# Patient Record
Sex: Female | Born: 1980 | Race: Black or African American | Hispanic: No | Marital: Single | Smoking: Never smoker
Health system: Southern US, Community
[De-identification: ages and names within clinical notes are randomized; demographics above are authoritative.]

## PROBLEM LIST (undated history)

## (undated) ENCOUNTER — Inpatient Hospital Stay (HOSPITAL_COMMUNITY): Payer: Self-pay

## (undated) ENCOUNTER — Emergency Department (HOSPITAL_COMMUNITY): Admission: EM | Payer: Medicaid Other | Source: Home / Self Care

## (undated) DIAGNOSIS — B009 Herpesviral infection, unspecified: Secondary | ICD-10-CM

## (undated) HISTORY — PX: ROTATOR CUFF REPAIR: SHX139

## (undated) HISTORY — PX: NO PAST SURGERIES: SHX2092

---

## 2000-01-28 ENCOUNTER — Other Ambulatory Visit: Admission: RE | Admit: 2000-01-28 | Discharge: 2000-01-28 | Payer: Self-pay | Admitting: Obstetrics

## 2000-04-08 ENCOUNTER — Inpatient Hospital Stay (HOSPITAL_COMMUNITY): Admission: AD | Admit: 2000-04-08 | Discharge: 2000-04-08 | Payer: Self-pay | Admitting: Obstetrics

## 2000-07-03 ENCOUNTER — Encounter: Payer: Self-pay | Admitting: Obstetrics

## 2000-07-03 ENCOUNTER — Inpatient Hospital Stay (HOSPITAL_COMMUNITY): Admission: AD | Admit: 2000-07-03 | Discharge: 2000-07-03 | Payer: Self-pay | Admitting: Obstetrics

## 2000-07-10 ENCOUNTER — Inpatient Hospital Stay (HOSPITAL_COMMUNITY): Admission: AD | Admit: 2000-07-10 | Discharge: 2000-07-12 | Payer: Self-pay | Admitting: Obstetrics

## 2001-01-25 ENCOUNTER — Other Ambulatory Visit: Admission: RE | Admit: 2001-01-25 | Discharge: 2001-01-25 | Payer: Self-pay | Admitting: Obstetrics

## 2001-01-31 ENCOUNTER — Emergency Department (HOSPITAL_COMMUNITY): Admission: EM | Admit: 2001-01-31 | Discharge: 2001-01-31 | Payer: Self-pay | Admitting: Internal Medicine

## 2001-02-02 ENCOUNTER — Emergency Department (HOSPITAL_COMMUNITY): Admission: EM | Admit: 2001-02-02 | Discharge: 2001-02-02 | Payer: Self-pay | Admitting: Emergency Medicine

## 2001-07-29 ENCOUNTER — Emergency Department (HOSPITAL_COMMUNITY): Admission: EM | Admit: 2001-07-29 | Discharge: 2001-07-29 | Payer: Self-pay | Admitting: Emergency Medicine

## 2002-01-13 ENCOUNTER — Other Ambulatory Visit: Admission: RE | Admit: 2002-01-13 | Discharge: 2002-01-13 | Payer: Self-pay | Admitting: Ophthalmology

## 2002-02-28 ENCOUNTER — Emergency Department (HOSPITAL_COMMUNITY): Admission: EM | Admit: 2002-02-28 | Discharge: 2002-02-28 | Payer: Self-pay | Admitting: Emergency Medicine

## 2002-04-28 ENCOUNTER — Inpatient Hospital Stay (HOSPITAL_COMMUNITY): Admission: AD | Admit: 2002-04-28 | Discharge: 2002-04-28 | Payer: Self-pay | Admitting: *Deleted

## 2002-04-29 ENCOUNTER — Inpatient Hospital Stay (HOSPITAL_COMMUNITY): Admission: RE | Admit: 2002-04-29 | Discharge: 2002-04-29 | Payer: Self-pay | Admitting: *Deleted

## 2002-11-12 ENCOUNTER — Encounter: Payer: Self-pay | Admitting: Obstetrics

## 2002-11-12 ENCOUNTER — Inpatient Hospital Stay (HOSPITAL_COMMUNITY): Admission: AD | Admit: 2002-11-12 | Discharge: 2002-11-12 | Payer: Self-pay | Admitting: Obstetrics

## 2002-11-17 ENCOUNTER — Inpatient Hospital Stay (HOSPITAL_COMMUNITY): Admission: AD | Admit: 2002-11-17 | Discharge: 2002-11-20 | Payer: Self-pay | Admitting: Obstetrics

## 2003-05-22 ENCOUNTER — Emergency Department (HOSPITAL_COMMUNITY): Admission: EM | Admit: 2003-05-22 | Discharge: 2003-05-22 | Payer: Self-pay | Admitting: Emergency Medicine

## 2004-02-15 ENCOUNTER — Emergency Department (HOSPITAL_COMMUNITY): Admission: EM | Admit: 2004-02-15 | Discharge: 2004-02-15 | Payer: Self-pay | Admitting: Emergency Medicine

## 2005-07-19 ENCOUNTER — Emergency Department (HOSPITAL_COMMUNITY): Admission: EM | Admit: 2005-07-19 | Discharge: 2005-07-19 | Payer: Self-pay | Admitting: Family Medicine

## 2006-03-05 ENCOUNTER — Ambulatory Visit (HOSPITAL_BASED_OUTPATIENT_CLINIC_OR_DEPARTMENT_OTHER): Admission: RE | Admit: 2006-03-05 | Discharge: 2006-03-05 | Payer: Self-pay | Admitting: Family Medicine

## 2006-03-14 ENCOUNTER — Ambulatory Visit: Payer: Self-pay | Admitting: Internal Medicine

## 2006-08-25 ENCOUNTER — Other Ambulatory Visit: Admission: RE | Admit: 2006-08-25 | Discharge: 2006-08-25 | Payer: Self-pay | Admitting: Family Medicine

## 2008-02-21 ENCOUNTER — Other Ambulatory Visit: Admission: RE | Admit: 2008-02-21 | Discharge: 2008-02-21 | Payer: Self-pay | Admitting: Family Medicine

## 2009-01-07 ENCOUNTER — Emergency Department (HOSPITAL_COMMUNITY): Admission: EM | Admit: 2009-01-07 | Discharge: 2009-01-07 | Payer: Self-pay | Admitting: Family Medicine

## 2009-04-20 ENCOUNTER — Inpatient Hospital Stay (HOSPITAL_COMMUNITY): Admission: AD | Admit: 2009-04-20 | Discharge: 2009-04-21 | Payer: Self-pay | Admitting: Obstetrics & Gynecology

## 2009-06-11 ENCOUNTER — Inpatient Hospital Stay (HOSPITAL_COMMUNITY): Admission: AD | Admit: 2009-06-11 | Discharge: 2009-06-11 | Payer: Self-pay | Admitting: Obstetrics & Gynecology

## 2009-06-20 ENCOUNTER — Encounter: Admission: RE | Admit: 2009-06-20 | Discharge: 2009-06-20 | Payer: Self-pay | Admitting: Family Medicine

## 2009-08-02 ENCOUNTER — Inpatient Hospital Stay (HOSPITAL_COMMUNITY): Admission: AD | Admit: 2009-08-02 | Discharge: 2009-08-02 | Payer: Self-pay | Admitting: Obstetrics and Gynecology

## 2009-09-09 ENCOUNTER — Inpatient Hospital Stay (HOSPITAL_COMMUNITY): Admission: AD | Admit: 2009-09-09 | Discharge: 2009-09-09 | Payer: Self-pay | Admitting: Obstetrics

## 2009-09-09 ENCOUNTER — Ambulatory Visit: Payer: Self-pay | Admitting: Physician Assistant

## 2009-12-03 ENCOUNTER — Inpatient Hospital Stay (HOSPITAL_COMMUNITY): Admission: AD | Admit: 2009-12-03 | Discharge: 2009-12-03 | Payer: Self-pay | Admitting: Obstetrics and Gynecology

## 2009-12-03 ENCOUNTER — Emergency Department (HOSPITAL_COMMUNITY): Admission: EM | Admit: 2009-12-03 | Discharge: 2009-12-03 | Payer: Self-pay | Admitting: Emergency Medicine

## 2009-12-14 ENCOUNTER — Inpatient Hospital Stay (HOSPITAL_COMMUNITY): Admission: AD | Admit: 2009-12-14 | Discharge: 2009-12-14 | Payer: Self-pay | Admitting: Obstetrics & Gynecology

## 2009-12-27 ENCOUNTER — Ambulatory Visit (HOSPITAL_COMMUNITY): Admission: RE | Admit: 2009-12-27 | Discharge: 2009-12-27 | Payer: Self-pay | Admitting: Obstetrics

## 2010-02-08 ENCOUNTER — Emergency Department (HOSPITAL_COMMUNITY): Admission: EM | Admit: 2010-02-08 | Discharge: 2010-02-08 | Payer: Self-pay | Admitting: Family Medicine

## 2010-12-07 NOTE — L&D Delivery Note (Signed)
Delivery Note At  a viable unspecified sex was delivered via  (Presentation: ;  ).  APGAR: , ; weight .   Placenta status: , .  Cord:  with the following complications: .  Cord pH: not done  Anesthesia: Epidural  Episiotomy:  Lacerations:  Suture Repair: 2.0 Est. Blood Loss (mL):   Mom to postpartum.  Baby to nursery-stable.  MARSHALL,BERNARD A 10/12/2011, 5:04 PM

## 2010-12-28 ENCOUNTER — Encounter: Payer: Self-pay | Admitting: Obstetrics

## 2011-01-15 ENCOUNTER — Emergency Department (HOSPITAL_COMMUNITY)
Admission: EM | Admit: 2011-01-15 | Discharge: 2011-01-15 | Disposition: A | Discharge status: Home / Self Care | Payer: No Typology Code available for payment source | Attending: Emergency Medicine | Admitting: Emergency Medicine

## 2011-01-15 ENCOUNTER — Emergency Department (HOSPITAL_COMMUNITY): Payer: No Typology Code available for payment source

## 2011-01-15 DIAGNOSIS — S139XXA Sprain of joints and ligaments of unspecified parts of neck, initial encounter: Secondary | ICD-10-CM | POA: Insufficient documentation

## 2011-01-15 DIAGNOSIS — M542 Cervicalgia: Secondary | ICD-10-CM | POA: Insufficient documentation

## 2011-01-15 DIAGNOSIS — Y92009 Unspecified place in unspecified non-institutional (private) residence as the place of occurrence of the external cause: Secondary | ICD-10-CM | POA: Insufficient documentation

## 2011-01-15 DIAGNOSIS — S0003XA Contusion of scalp, initial encounter: Secondary | ICD-10-CM | POA: Insufficient documentation

## 2011-01-15 DIAGNOSIS — S1093XA Contusion of unspecified part of neck, initial encounter: Secondary | ICD-10-CM | POA: Insufficient documentation

## 2011-01-15 DIAGNOSIS — S0993XA Unspecified injury of face, initial encounter: Secondary | ICD-10-CM | POA: Insufficient documentation

## 2011-01-24 ENCOUNTER — Inpatient Hospital Stay (INDEPENDENT_AMBULATORY_CARE_PROVIDER_SITE_OTHER)
Admission: RE | Admit: 2011-01-24 | Discharge: 2011-01-24 | Disposition: A | Discharge status: Home / Self Care | Payer: Medicaid Other | Source: Ambulatory Visit | Attending: Family Medicine | Admitting: Family Medicine

## 2011-01-24 DIAGNOSIS — R21 Rash and other nonspecific skin eruption: Secondary | ICD-10-CM

## 2011-01-24 DIAGNOSIS — N760 Acute vaginitis: Secondary | ICD-10-CM

## 2011-01-24 LAB — WET PREP, GENITAL

## 2011-01-24 LAB — POCT PREGNANCY, URINE: Preg Test, Ur: NEGATIVE

## 2011-01-24 LAB — POCT URINALYSIS DIPSTICK
Nitrite: NEGATIVE
Protein, ur: 30 mg/dL — AB
Urobilinogen, UA: 0.2 mg/dL (ref 0.0–1.0)

## 2011-01-26 LAB — GC/CHLAMYDIA PROBE AMP, GENITAL: Chlamydia, DNA Probe: NEGATIVE

## 2011-01-26 LAB — HERPES SIMPLEX VIRUS CULTURE: Culture: DETECTED

## 2011-01-26 LAB — URINE CULTURE: Culture: NO GROWTH

## 2011-02-05 ENCOUNTER — Inpatient Hospital Stay (HOSPITAL_COMMUNITY): Payer: Medicaid Other

## 2011-02-05 ENCOUNTER — Inpatient Hospital Stay (HOSPITAL_COMMUNITY)
Admission: AD | Admit: 2011-02-05 | Discharge: 2011-02-05 | Disposition: A | Discharge status: Home / Self Care | Payer: Medicaid Other | Source: Ambulatory Visit | Attending: Obstetrics & Gynecology | Admitting: Obstetrics & Gynecology

## 2011-02-05 DIAGNOSIS — O9989 Other specified diseases and conditions complicating pregnancy, childbirth and the puerperium: Secondary | ICD-10-CM

## 2011-02-05 DIAGNOSIS — O99891 Other specified diseases and conditions complicating pregnancy: Secondary | ICD-10-CM | POA: Insufficient documentation

## 2011-02-05 DIAGNOSIS — R109 Unspecified abdominal pain: Secondary | ICD-10-CM

## 2011-02-05 LAB — URINALYSIS, ROUTINE W REFLEX MICROSCOPIC
Bilirubin Urine: NEGATIVE
Nitrite: NEGATIVE
Specific Gravity, Urine: 1.01 (ref 1.005–1.030)
Urobilinogen, UA: 0.2 mg/dL (ref 0.0–1.0)

## 2011-02-05 LAB — CBC
MCH: 28.2 pg (ref 26.0–34.0)
MCHC: 33 g/dL (ref 30.0–36.0)
MCV: 85.6 fL (ref 78.0–100.0)
Platelets: 376 10*3/uL (ref 150–400)
RDW: 13.4 % (ref 11.5–15.5)
WBC: 7.6 10*3/uL (ref 4.0–10.5)

## 2011-02-05 LAB — DIFFERENTIAL
Eosinophils Absolute: 0.1 10*3/uL (ref 0.0–0.7)
Eosinophils Relative: 2 % (ref 0–5)
Lymphs Abs: 2.1 10*3/uL (ref 0.7–4.0)
Monocytes Relative: 7 % (ref 3–12)

## 2011-02-05 LAB — COMPREHENSIVE METABOLIC PANEL
ALT: 14 U/L (ref 0–35)
AST: 18 U/L (ref 0–37)
CO2: 28 mEq/L (ref 19–32)
Chloride: 106 mEq/L (ref 96–112)
GFR calc Af Amer: >60 mL/min (ref >60)
GFR calc non Af Amer: >60 mL/min (ref >60)
Sodium: 138 mEq/L (ref 135–145)
Total Bilirubin: 0.6 mg/dL (ref 0.3–1.2)

## 2011-02-05 LAB — POCT PREGNANCY, URINE: Preg Test, Ur: POSITIVE

## 2011-02-05 LAB — URINE MICROSCOPIC-ADD ON

## 2011-02-22 LAB — WET PREP, GENITAL
Trich, Wet Prep: NONE SEEN
Yeast Wet Prep HPF POC: NONE SEEN

## 2011-02-22 LAB — URINALYSIS, ROUTINE W REFLEX MICROSCOPIC
Glucose, UA: NEGATIVE mg/dL
Hgb urine dipstick: NEGATIVE
Ketones, ur: NEGATIVE mg/dL
Protein, ur: NEGATIVE mg/dL
pH: 5.5 (ref 5.0–8.0)

## 2011-02-22 LAB — POCT PREGNANCY, URINE: Preg Test, Ur: NEGATIVE

## 2011-02-22 LAB — GC/CHLAMYDIA PROBE AMP, GENITAL: Chlamydia, DNA Probe: NEGATIVE

## 2011-03-09 LAB — URINALYSIS, ROUTINE W REFLEX MICROSCOPIC
Bilirubin Urine: NEGATIVE
Glucose, UA: NEGATIVE mg/dL
Ketones, ur: NEGATIVE mg/dL
Ketones, ur: NEGATIVE mg/dL
Nitrite: NEGATIVE
Protein, ur: NEGATIVE mg/dL
Urobilinogen, UA: 0.2 mg/dL (ref 0.0–1.0)

## 2011-03-09 LAB — WET PREP, GENITAL

## 2011-03-09 LAB — GC/CHLAMYDIA PROBE AMP, GENITAL: Chlamydia, DNA Probe: NEGATIVE

## 2011-03-09 LAB — URINE MICROSCOPIC-ADD ON

## 2011-03-09 LAB — PREGNANCY, URINE: Preg Test, Ur: NEGATIVE

## 2011-03-10 LAB — SICKLE CELL SCREEN: Sickle Cell Screen: POSITIVE

## 2011-03-13 LAB — GC/CHLAMYDIA PROBE AMP, GENITAL
Chlamydia, DNA Probe: NEGATIVE
GC Probe Amp, Genital: NEGATIVE

## 2011-03-13 LAB — URINALYSIS, ROUTINE W REFLEX MICROSCOPIC
Glucose, UA: NEGATIVE mg/dL
Hgb urine dipstick: NEGATIVE
Protein, ur: NEGATIVE mg/dL
pH: 7 (ref 5.0–8.0)

## 2011-03-13 LAB — WET PREP, GENITAL
Trich, Wet Prep: NONE SEEN
Yeast Wet Prep HPF POC: NONE SEEN

## 2011-03-16 LAB — HCG, QUANTITATIVE, PREGNANCY: hCG, Beta Chain, Quant, S: <2 m[IU]/mL (ref <5)

## 2011-03-16 LAB — COMPREHENSIVE METABOLIC PANEL
BUN: 10 mg/dL (ref 6–23)
CO2: 25 mEq/L (ref 19–32)
Calcium: 9.5 mg/dL (ref 8.4–10.5)
GFR calc non Af Amer: >60 mL/min (ref >60)
Glucose, Bld: 84 mg/dL (ref 70–99)
Total Protein: 7.2 g/dL (ref 6.0–8.3)

## 2011-03-16 LAB — ABO/RH: ABO/RH(D): O POS

## 2011-03-16 LAB — CBC
HCT: 38.8 % (ref 36.0–46.0)
Hemoglobin: 13.3 g/dL (ref 12.0–15.0)
MCHC: 34.2 g/dL (ref 30.0–36.0)
MCV: 90.1 fL (ref 78.0–100.0)
RDW: 12.5 % (ref 11.5–15.5)

## 2011-03-17 LAB — CBC
HCT: 34.1 % — ABNORMAL LOW (ref 36.0–46.0)
Hemoglobin: 11.9 g/dL — ABNORMAL LOW (ref 12.0–15.0)
RBC: 3.83 MIL/uL — ABNORMAL LOW (ref 3.87–5.11)
RDW: 12.8 % (ref 11.5–15.5)

## 2011-03-17 LAB — URINALYSIS, ROUTINE W REFLEX MICROSCOPIC
Bilirubin Urine: NEGATIVE
Nitrite: NEGATIVE
Protein, ur: NEGATIVE mg/dL
Specific Gravity, Urine: >1.03 — ABNORMAL HIGH (ref 1.005–1.030)
Urobilinogen, UA: 0.2 mg/dL (ref 0.0–1.0)

## 2011-03-17 LAB — WET PREP, GENITAL: Clue Cells Wet Prep HPF POC: NONE SEEN

## 2011-03-17 LAB — GC/CHLAMYDIA PROBE AMP, GENITAL
Chlamydia, DNA Probe: NEGATIVE
GC Probe Amp, Genital: NEGATIVE

## 2011-03-24 LAB — WET PREP, GENITAL
Trich, Wet Prep: NONE SEEN
Yeast Wet Prep HPF POC: NONE SEEN

## 2011-03-24 LAB — POCT URINALYSIS DIP (DEVICE)
Bilirubin Urine: NEGATIVE
Glucose, UA: NEGATIVE mg/dL
Nitrite: NEGATIVE
Specific Gravity, Urine: 1.015 (ref 1.005–1.030)
pH: 6.5 (ref 5.0–8.0)

## 2011-03-24 LAB — URINE CULTURE: Colony Count: 40000

## 2011-03-24 LAB — RPR: RPR Ser Ql: NONREACTIVE

## 2011-03-24 LAB — GC/CHLAMYDIA PROBE AMP, GENITAL: Chlamydia, DNA Probe: NEGATIVE

## 2011-04-24 NOTE — Procedures (Signed)
NAME:  Julia Chang, Julia Chang             ACCOUNT NO.:  [id number]   MEDICAL RECORD NO.:  [id number]          PATIENT TYPE:  OUT   LOCATION:  SLEEP CENTER                 FACILITY:  [facility]   PHYSICIAN:  Clinton D. Maple Hudson, M.D. DATE OF BIRTH:  [birth year]   DATE OF STUDY:  03/05/2006                              NOCTURNAL POLYSOMNOGRAM   REFERRING PHYSICIAN:  Dr. Renaye Rakers.   INDICATIONS FOR STUDY:  Hypersomnia with daytime somnolence.  Epworth  sleepiness score 23/24, BMI 28.9.  Weight 180 pounds.  No home medications  listed.   SLEEP ARCHITECTURE:  Total sleep time 395 minutes with sleep efficiency 97%.  Stage I was 3%, stage II 57%, stages III and IV 22%, REM 18% of total sleep  time.  Sleep latency 5 minutes, REM latency 63 minutes, awake after sleep  onset 7 minutes, arousal index increased at 38.9 indicating increased sleep  fragmentation not explained by other study results.  Technician commented  that the patient was very sleepy when she arrived, stating that she had been  up since 4:30 a.m.   RESPIRATORY DATA:  Apnea/hypopnea index (HPI, RDI) 5.6 obstructive events  per hour indicating minimal obstructive sleep apnea/hypopnea syndrome  (normal range 0 to 5 per hour).  She slept only supine.  REM AHI 17.3 per  hour.  MPSG protocol was requested.   OXYGEN DATA:  Mild snoring with oxygen desaturation to a nadir of 92%.   CARDIAC DATA:  Normal sinus rhythm.   MOVEMENT/PARASOMNIA:  Occasional limb jerks with insignificant effect on  sleep.   IMPRESSION/RECOMMENDATIONS:  1.  Unremarkable sleep architecture with relatively high stages III and IV,      not inconsistent with the patient's age.  2.  Minimal obstructive sleep apnea/hypopnea syndrome, AHI 5.6 per hour with      mild snoring and normal oxygenation.  3.  Periodic limb movement with arousal, 1.8 per hour.  4.  The degree of daytime sleepiness described and supported by the      patient's Epworth sleepiness score of 23/24  seemed out of line with the      sleep architecture, sleep disordered breathing and periodic limb      movement frequency recorded on this study.  If history suggests the      possibility of narcolepsy or another primary disorder      of excessive daytime somnolence as opposed to insufficient sleep due to      poor sleep hygiene, then consider return for a multiple sleep latency      test (MSLT).      Clinton D. Maple Hudson, M.D.  Diplomate, Biomedical engineer of Sleep Medicine  Electronically Signed     CDY/MEDQ  D:  03/14/2006 10:43:13  T:  03/15/2006 06:10:46  Job:  [id number]

## 2011-06-03 ENCOUNTER — Inpatient Hospital Stay (HOSPITAL_COMMUNITY): Payer: Medicaid Other

## 2011-06-03 ENCOUNTER — Inpatient Hospital Stay (HOSPITAL_COMMUNITY)
Admission: AD | Admit: 2011-06-03 | Discharge: 2011-06-03 | Disposition: A | Discharge status: Home / Self Care | Payer: Medicaid Other | Source: Ambulatory Visit | Attending: Obstetrics | Admitting: Obstetrics

## 2011-06-03 DIAGNOSIS — O9989 Other specified diseases and conditions complicating pregnancy, childbirth and the puerperium: Secondary | ICD-10-CM

## 2011-06-03 DIAGNOSIS — O99891 Other specified diseases and conditions complicating pregnancy: Secondary | ICD-10-CM | POA: Insufficient documentation

## 2011-06-03 DIAGNOSIS — R109 Unspecified abdominal pain: Secondary | ICD-10-CM | POA: Insufficient documentation

## 2011-06-03 DIAGNOSIS — Y92410 Unspecified street and highway as the place of occurrence of the external cause: Secondary | ICD-10-CM | POA: Insufficient documentation

## 2011-06-03 LAB — URINALYSIS, ROUTINE W REFLEX MICROSCOPIC
Bilirubin Urine: NEGATIVE
Nitrite: NEGATIVE
Protein, ur: NEGATIVE mg/dL
Specific Gravity, Urine: 1.025 (ref 1.005–1.030)
Urobilinogen, UA: 0.2 mg/dL (ref 0.0–1.0)

## 2011-08-09 ENCOUNTER — Encounter (HOSPITAL_COMMUNITY): Payer: Self-pay | Admitting: *Deleted

## 2011-08-09 ENCOUNTER — Inpatient Hospital Stay (HOSPITAL_COMMUNITY)
Admission: AD | Admit: 2011-08-09 | Discharge: 2011-08-09 | Disposition: A | Discharge status: Home / Self Care | Payer: Medicaid Other | Source: Ambulatory Visit | Attending: Obstetrics | Admitting: Obstetrics

## 2011-08-09 DIAGNOSIS — R109 Unspecified abdominal pain: Secondary | ICD-10-CM

## 2011-08-09 DIAGNOSIS — B3731 Acute candidiasis of vulva and vagina: Secondary | ICD-10-CM

## 2011-08-09 DIAGNOSIS — O99891 Other specified diseases and conditions complicating pregnancy: Secondary | ICD-10-CM | POA: Insufficient documentation

## 2011-08-09 DIAGNOSIS — B373 Candidiasis of vulva and vagina: Secondary | ICD-10-CM | POA: Insufficient documentation

## 2011-08-09 DIAGNOSIS — O2390 Unspecified genitourinary tract infection in pregnancy, unspecified trimester: Secondary | ICD-10-CM | POA: Insufficient documentation

## 2011-08-09 DIAGNOSIS — O26899 Other specified pregnancy related conditions, unspecified trimester: Secondary | ICD-10-CM

## 2011-08-09 HISTORY — DX: Herpesviral infection, unspecified: B00.9

## 2011-08-09 LAB — URINE MICROSCOPIC-ADD ON

## 2011-08-09 LAB — WET PREP, GENITAL: Yeast Wet Prep HPF POC: NONE SEEN

## 2011-08-09 LAB — URINALYSIS, ROUTINE W REFLEX MICROSCOPIC
Glucose, UA: NEGATIVE mg/dL
Ketones, ur: NEGATIVE mg/dL
Protein, ur: NEGATIVE mg/dL
Urobilinogen, UA: 0.2 mg/dL (ref 0.0–1.0)

## 2011-08-09 MED ORDER — TERCONAZOLE 0.4 % VA CREA: 1.0000 | TOPICAL_CREAM | Freq: Every day | VAGINAL | Status: AC

## 2011-08-09 NOTE — Progress Notes (Signed)
Patient is brought in by ems with c/o a sudden onset of upper abdominal constant sharp pain 2 hours ago. She is moving about in pain and moaning loudly. She denies any vaginal bleeding, lof or discharge. Her abdominal is soft and non-tender. She reports good fetal movement. Denies any problem this pregnant.

## 2011-08-09 NOTE — Progress Notes (Signed)
Pt states, " I started having pain in my upper abdomen approx 2 hrs ago

## 2011-08-09 NOTE — ED Provider Notes (Signed)
History     Chief Complaint  Patient presents with  . Abdominal Pain   HPI Julia Chang 30 y.o. gestation 31weeks arrives in MAU via EMS with abdominal pain.  Client moaning and having difficulty being still in the bed.  IV infusing.  FHT heard.   OB History    Grav Para Term Preterm Abortions TAB SAB Ect Mult Living   3 2 2       2       Past Medical History  Diagnosis Date  . No pertinent past medical history   . 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 No past surgeries     No family history on file.  History  Substance Use Topics  . Smoking status: Never Smoker   . Smokeless tobacco: Not on file  . Alcohol Use: No    Allergies: No Known Allergies  Prescriptions prior to admission  Medication Sig Dispense Refill  . prenatal vitamin w/FE, FA (PRENATAL 1 + 1) 27-1 MG TABS Take 1 tablet by mouth daily.          Review of Systems  Gastrointestinal: Positive for abdominal pain. Negative for nausea and vomiting.  Genitourinary:       No vaginal discharge. No vaginal bleeding. No dysuria.     Physical Exam   Blood pressure 120/60, pulse 97, temperature 97.5 F (36.4 C), temperature source Oral, resp. rate 20, last menstrual period 01/03/2011.  Physical Exam  Nursing note and vitals reviewed. Constitutional: She is oriented to person, place, and time. She appears well-developed and well-nourished.  HENT:  Head: Normocephalic.  Eyes: EOM are normal.  Neck: Neck supple.  GI: Soft. There is no tenderness. There is no rebound and no guarding.       Fetal monitor - FHT baseline 145.  Reactive strip.  Occassional contraction only  Genitourinary:       Speculum exam: Vagina - erythema noted with Large amount of yellow curdy discharge, no odor Cervix - No contact bleeding Bimanual exam: Cervix internal os closed, soft, thick, Vtx ballotable Uterus appropriate for gestation, nontender Adnexa non tender, no  masses bilaterally  wet prep done Chaperone present for exam.    Musculoskeletal: Normal range of motion.  Neurological: She is alert and oriented to person, place, and time.  Skin: Skin is warm and dry.  Psychiatric: She has a normal mood and affect.  Pain was upper abdomen only.   After resting in bed and walking to bathroom, pain spontaneously resolved.  Observed client more than 30 minutes and pain did not return.  Sleepy and will send home.  MAU Course  Procedures  MDM Consult with Dr. Clearance Coots and reviewed plan of care.   Urine culture pending.  Assessment and Plan  Abdominal pain in pregnancy - possibly GI in nature as resolved spontaneously. Yeast infection  Plan: Keep scheduled visits in office. Call your doctor if you have questions or concerns. Will treat with Terazol vaginal cream.  BURLESON,TERRI 08/09/2011, 3:26 AM   Nolene Bernheim, NP 08/09/11 [phone]

## 2011-09-04 LAB — STREP B DNA PROBE: GBS: POSITIVE

## 2011-09-27 ENCOUNTER — Inpatient Hospital Stay (HOSPITAL_COMMUNITY)
Admission: AD | Admit: 2011-09-27 | Discharge: 2011-09-27 | Disposition: A | Discharge status: Home / Self Care | Payer: Medicaid Other | Source: Ambulatory Visit | Attending: Obstetrics | Admitting: Obstetrics

## 2011-09-27 DIAGNOSIS — O479 False labor, unspecified: Secondary | ICD-10-CM | POA: Insufficient documentation

## 2011-09-27 LAB — STREP B DNA PROBE: GBS: POSITIVE

## 2011-09-27 NOTE — ED Notes (Signed)
Notified Dr. Gaynell Face pt 30 y.o. G3P2 38.1 labor check, contractions every 4 to 6 minutes mild to moderate, cervix 0/50%/-2 fhr reactive, received order to have patient walk for one hour and recheck cervix

## 2011-09-27 NOTE — Progress Notes (Signed)
Pt reports having ctx for several day now about 5 min apart and more uncomfortable. Denies SROM or bleeding. Reports good fetal movement

## 2011-10-09 ENCOUNTER — Encounter (HOSPITAL_COMMUNITY): Payer: Self-pay | Admitting: *Deleted

## 2011-10-09 ENCOUNTER — Telehealth (HOSPITAL_COMMUNITY): Payer: Self-pay | Admitting: *Deleted

## 2011-10-09 NOTE — Telephone Encounter (Signed)
Preadmission screen  

## 2011-10-12 ENCOUNTER — Encounter (HOSPITAL_COMMUNITY): Payer: Self-pay | Admitting: Anesthesiology

## 2011-10-12 ENCOUNTER — Inpatient Hospital Stay (HOSPITAL_COMMUNITY): Payer: Medicaid Other | Admitting: Anesthesiology

## 2011-10-12 ENCOUNTER — Inpatient Hospital Stay (HOSPITAL_COMMUNITY)
Admission: RE | Admit: 2011-10-12 | Discharge: 2011-10-14 | DRG: 775 | Disposition: A | Discharge status: Home / Self Care | Payer: Medicaid Other | Source: Ambulatory Visit | Attending: Obstetrics | Admitting: Obstetrics

## 2011-10-12 ENCOUNTER — Encounter (HOSPITAL_COMMUNITY): Payer: Self-pay

## 2011-10-12 DIAGNOSIS — Z22330 Carrier of Group B streptococcus: Secondary | ICD-10-CM

## 2011-10-12 DIAGNOSIS — O99892 Other specified diseases and conditions complicating childbirth: Principal | ICD-10-CM | POA: Diagnosis present

## 2011-10-12 LAB — CBC
HCT: 36.2 % (ref 36.0–46.0)
MCH: 30.9 pg (ref 26.0–34.0)
MCHC: 34.5 g/dL (ref 30.0–36.0)
MCV: 89.6 fL (ref 78.0–100.0)
Platelets: 267 10*3/uL (ref 150–400)
RDW: 13.4 % (ref 11.5–15.5)

## 2011-10-12 MED ORDER — FENTANYL 2.5 MCG/ML BUPIVACAINE 1/10 % EPIDURAL INFUSION (WH - ANES)
14.0000 mL/h | INTRAMUSCULAR | Status: DC
  Administered 2011-10-12: 14 mL/h via EPIDURAL
  Filled 2011-10-12 (×2): qty 60

## 2011-10-12 MED ORDER — LIDOCAINE HCL (PF) 1 % IJ SOLN
30.0000 mL | INTRAMUSCULAR | Status: DC | PRN
  Filled 2011-10-12: qty 30

## 2011-10-12 MED ORDER — SENNOSIDES-DOCUSATE SODIUM 8.6-50 MG PO TABS
2.0000 | ORAL_TABLET | Freq: Every day | ORAL | Status: DC
  Administered 2011-10-12 – 2011-10-13 (×2): 2 via ORAL

## 2011-10-12 MED ORDER — EPHEDRINE 5 MG/ML INJ: 10.0000 mg | INTRAVENOUS | Status: DC | PRN

## 2011-10-12 MED ORDER — LANOLIN HYDROUS EX OINT: TOPICAL_OINTMENT | CUTANEOUS | Status: DC | PRN

## 2011-10-12 MED ORDER — PROMETHAZINE HCL 25 MG/ML IJ SOLN: 12.5000 mg | INTRAMUSCULAR | Status: DC | PRN

## 2011-10-12 MED ORDER — FLEET ENEMA 7-19 GM/118ML RE ENEM: 1.0000 | ENEMA | RECTAL | Status: DC | PRN

## 2011-10-12 MED ORDER — BUTORPHANOL TARTRATE 2 MG/ML IJ SOLN: 1.0000 mg | INTRAMUSCULAR | Status: DC | PRN

## 2011-10-12 MED ORDER — CITRIC ACID-SODIUM CITRATE 334-500 MG/5ML PO SOLN: 30.0000 mL | ORAL | Status: DC | PRN

## 2011-10-12 MED ORDER — PHENYLEPHRINE 40 MCG/ML (10ML) SYRINGE FOR IV PUSH (FOR BLOOD PRESSURE SUPPORT)
80.0000 ug | PREFILLED_SYRINGE | INTRAVENOUS | Status: DC | PRN
  Filled 2011-10-12 (×2): qty 5

## 2011-10-12 MED ORDER — PRENATAL PLUS 27-1 MG PO TABS
1.0000 | ORAL_TABLET | Freq: Every day | ORAL | Status: DC
  Administered 2011-10-13: 1 via ORAL
  Filled 2011-10-12: qty 1

## 2011-10-12 MED ORDER — LACTATED RINGERS IV SOLN
INTRAVENOUS | Status: DC
  Administered 2011-10-12 (×3): via INTRAVENOUS

## 2011-10-12 MED ORDER — TETANUS-DIPHTH-ACELL PERTUSSIS 5-2.5-18.5 LF-MCG/0.5 IM SUSP
0.5000 mL | Freq: Once | INTRAMUSCULAR | Status: AC
  Administered 2011-10-13: 0.5 mL via INTRAMUSCULAR
  Filled 2011-10-12: qty 0.5

## 2011-10-12 MED ORDER — DIPHENHYDRAMINE HCL 50 MG/ML IJ SOLN
12.5000 mg | INTRAMUSCULAR | Status: DC | PRN
Start: 2011-10-12 — End: 2011-10-14

## 2011-10-12 MED ORDER — OXYTOCIN 10 UNIT/ML IJ SOLN
INTRAMUSCULAR | Status: AC
  Filled 2011-10-12: qty 2

## 2011-10-12 MED ORDER — OXYTOCIN 20 UNITS IN LACTATED RINGERS INFUSION - SIMPLE
1.0000 m[IU]/min | INTRAVENOUS | Status: DC
  Administered 2011-10-12: 1 m[IU]/min via INTRAVENOUS
  Administered 2011-10-12: 333 m[IU]/min via INTRAVENOUS
  Filled 2011-10-12: qty 1000

## 2011-10-12 MED ORDER — WITCH HAZEL-GLYCERIN EX PADS: 1.0000 "application " | MEDICATED_PAD | CUTANEOUS | Status: DC | PRN

## 2011-10-12 MED ORDER — OXYTOCIN BOLUS FROM INFUSION
500.0000 mL | Freq: Once | INTRAVENOUS | Status: DC
  Filled 2011-10-12: qty 500

## 2011-10-12 MED ORDER — PHENYLEPHRINE 40 MCG/ML (10ML) SYRINGE FOR IV PUSH (FOR BLOOD PRESSURE SUPPORT): 80.0000 ug | PREFILLED_SYRINGE | INTRAVENOUS | Status: DC | PRN

## 2011-10-12 MED ORDER — ONDANSETRON HCL 4 MG/2ML IJ SOLN: 4.0000 mg | INTRAMUSCULAR | Status: DC | PRN

## 2011-10-12 MED ORDER — PENICILLIN G POTASSIUM 5000000 UNITS IJ SOLR
5.0000 10*6.[IU] | Freq: Once | INTRAVENOUS | Status: AC
  Administered 2011-10-12: 5 10*6.[IU] via INTRAVENOUS
  Filled 2011-10-12: qty 5

## 2011-10-12 MED ORDER — LACTATED RINGERS IV SOLN: 500.0000 mL | INTRAVENOUS | Status: DC | PRN

## 2011-10-12 MED ORDER — ONDANSETRON HCL 4 MG PO TABS: 4.0000 mg | ORAL_TABLET | ORAL | Status: DC | PRN

## 2011-10-12 MED ORDER — BENZOCAINE-MENTHOL 20-0.5 % EX AERO: 1.0000 "application " | INHALATION_SPRAY | CUTANEOUS | Status: DC | PRN

## 2011-10-12 MED ORDER — EPHEDRINE 5 MG/ML INJ
10.0000 mg | INTRAVENOUS | Status: DC | PRN
  Filled 2011-10-12: qty 4

## 2011-10-12 MED ORDER — FERROUS SULFATE 325 (65 FE) MG PO TABS
325.0000 mg | ORAL_TABLET | Freq: Two times a day (BID) | ORAL | Status: DC
  Administered 2011-10-13 (×2): 325 mg via ORAL
  Filled 2011-10-12 (×2): qty 1

## 2011-10-12 MED ORDER — FENTANYL 2.5 MCG/ML BUPIVACAINE 1/10 % EPIDURAL INFUSION (WH - ANES)
INTRAMUSCULAR | Status: DC | PRN
  Administered 2011-10-12: 14 mL/h via EPIDURAL

## 2011-10-12 MED ORDER — OXYCODONE-ACETAMINOPHEN 5-325 MG PO TABS
1.0000 | ORAL_TABLET | ORAL | Status: DC | PRN
  Administered 2011-10-13 – 2011-10-14 (×2): 1 via ORAL
  Filled 2011-10-12: qty 1

## 2011-10-12 MED ORDER — TERBUTALINE SULFATE 1 MG/ML IJ SOLN: 0.2500 mg | Freq: Once | INTRAMUSCULAR | Status: DC | PRN

## 2011-10-12 MED ORDER — ACETAMINOPHEN 325 MG PO TABS: 650.0000 mg | ORAL_TABLET | ORAL | Status: DC | PRN

## 2011-10-12 MED ORDER — ZOLPIDEM TARTRATE 5 MG PO TABS: 5.0000 mg | ORAL_TABLET | Freq: Every evening | ORAL | Status: DC | PRN

## 2011-10-12 MED ORDER — IBUPROFEN 600 MG PO TABS
600.0000 mg | ORAL_TABLET | Freq: Four times a day (QID) | ORAL | Status: DC | PRN
  Administered 2011-10-12: 600 mg via ORAL
  Filled 2011-10-12: qty 1

## 2011-10-12 MED ORDER — IBUPROFEN 600 MG PO TABS
600.0000 mg | ORAL_TABLET | Freq: Four times a day (QID) | ORAL | Status: DC
  Administered 2011-10-13 – 2011-10-14 (×6): 600 mg via ORAL
  Filled 2011-10-12 (×6): qty 1

## 2011-10-12 MED ORDER — DIPHENHYDRAMINE HCL 25 MG PO CAPS: 25.0000 mg | ORAL_CAPSULE | Freq: Four times a day (QID) | ORAL | Status: DC | PRN

## 2011-10-12 MED ORDER — LACTATED RINGERS IV SOLN: 500.0000 mL | Freq: Once | INTRAVENOUS | Status: DC

## 2011-10-12 MED ORDER — OXYCODONE-ACETAMINOPHEN 5-325 MG PO TABS
2.0000 | ORAL_TABLET | ORAL | Status: DC | PRN
  Filled 2011-10-12: qty 1

## 2011-10-12 MED ORDER — ONDANSETRON HCL 4 MG/2ML IJ SOLN: 4.0000 mg | Freq: Four times a day (QID) | INTRAMUSCULAR | Status: DC | PRN

## 2011-10-12 MED ORDER — SIMETHICONE 80 MG PO CHEW: 80.0000 mg | CHEWABLE_TABLET | ORAL | Status: DC | PRN

## 2011-10-12 MED ORDER — OXYTOCIN 20 UNITS IN LACTATED RINGERS INFUSION - SIMPLE: 125.0000 mL/h | Freq: Once | INTRAVENOUS | Status: DC

## 2011-10-12 MED ORDER — PENICILLIN G POTASSIUM 5000000 UNITS IJ SOLR
2.5000 10*6.[IU] | INTRAVENOUS | Status: DC
  Administered 2011-10-12 (×2): 2.5 10*6.[IU] via INTRAVENOUS
  Filled 2011-10-12 (×5): qty 2.5

## 2011-10-12 MED ORDER — DIBUCAINE 1 % RE OINT: 1.0000 "application " | TOPICAL_OINTMENT | RECTAL | Status: DC | PRN

## 2011-10-12 MED ORDER — LIDOCAINE HCL 1.5 % IJ SOLN
INTRAMUSCULAR | Status: DC | PRN
  Administered 2011-10-12 (×2): 4 mL via EPIDURAL

## 2011-10-12 NOTE — Progress Notes (Signed)
  Cervix 2 cm 90% vertex -2 station membranes ruptured artificially fluids clear She is contracting irregularly and is on low-dose Pitocin Continue present therapy

## 2011-10-12 NOTE — Anesthesia Procedure Notes (Addendum)
Epidural Patient location during procedure: OB Start time: 10/12/2011 11:33 AM  Staffing Anesthesiologist: Vashon Riordan A. Performed by: anesthesiologist   Preanesthetic Checklist Completed: patient identified, site marked, surgical consent, pre-op evaluation, timeout performed, IV checked, risks and benefits discussed and monitors and equipment checked  Epidural Patient position: sitting Prep: site prepped and draped and DuraPrep Patient monitoring: continuous pulse ox and blood pressure Approach: midline Injection technique: LOR air  Needle:  Needle type: Tuohy  Needle gauge: 17 G Needle length: 9 cm Needle insertion depth: 6 cm Catheter type: closed end flexible Catheter size: 19 Gauge Catheter at skin depth: 11 cm Test dose: negative and 1.5% lidocaine  Assessment Events: blood not aspirated, injection not painful, no injection resistance, negative IV test and no paresthesia  Additional Notes Patient is more comfortable after epidural dosed. Please see RN's note for documentation of vital signs and FHR which are stable.

## 2011-10-12 NOTE — H&P (Signed)
This is Dr. Francoise Ceo dictating the history and physical on  Julia Chang she's a 30 year old gravida 3 para 2002 She's a 40 weeks and 2 days her EDC was 10/10/2011 shows a positive GBS and is here because she desired induction She's not contracting Cervix 1 cm 80% vertex -2 station Past medical history negative Social history negative Family history negative System review noncontributory Physical exam HEENT negative Breasts negative Lungs clear Heart regular rhythm no murmurs no gallops Abdomen term Pelvic as described above Extremities negative Patient here for induction with Pitocin

## 2011-10-12 NOTE — Anesthesia Preprocedure Evaluation (Signed)
Anesthesia Evaluation  Patient identified by MRN, date of birth, ID band Patient awake    Reviewed: Allergy & Precautions, H&P , Patient's Chart, lab work & pertinent test results  Airway Mallampati: III TM Distance: >3 FB Neck ROM: full    Dental No notable dental hx. (+) Teeth Intact   Pulmonary neg pulmonary ROS,  clear to auscultation  Pulmonary exam normal       Cardiovascular neg cardio ROS regular Normal    Neuro/Psych Negative Neurological ROS  Negative Psych ROS   GI/Hepatic negative GI ROS, Neg liver ROS,   Endo/Other  Negative Endocrine ROS  Renal/GU negative Renal ROS  Genitourinary negative   Musculoskeletal   Abdominal   Peds  Hematology negative hematology ROS (+)   Anesthesia Other Findings   Reproductive/Obstetrics (+) Pregnancy                           Anesthesia Physical Anesthesia Plan  ASA: II  Anesthesia Plan: Epidural   Post-op Pain Management:    Induction:   Airway Management Planned:   Additional Equipment:   Intra-op Plan:   Post-operative Plan:   Informed Consent: I have reviewed the patients History and Physical, chart, labs and discussed the procedure including the risks, benefits and alternatives for the proposed anesthesia with the patient or authorized representative who has indicated his/her understanding and acceptance.     Plan Discussed with: Anesthesiologist and Surgeon  Anesthesia Plan Comments:         Anesthesia Quick Evaluation  

## 2011-10-13 LAB — CBC
HCT: 32.8 % — ABNORMAL LOW (ref 36.0–46.0)
Hemoglobin: 11.3 g/dL — ABNORMAL LOW (ref 12.0–15.0)
MCV: 89.4 fL (ref 78.0–100.0)
RBC: 3.67 MIL/uL — ABNORMAL LOW (ref 3.87–5.11)
WBC: 13.7 10*3/uL — ABNORMAL HIGH (ref 4.0–10.5)

## 2011-10-13 NOTE — Progress Notes (Signed)
UR Chart review completed.  

## 2011-10-13 NOTE — Anesthesia Postprocedure Evaluation (Signed)
  Anesthesia Post-op Note  Patient: Julia Chang  Procedure(s) Performed: * No procedures listed *  Patient Location: Mother/Baby  Anesthesia Type: Epidural  Level of Consciousness: awake, alert  and oriented  Airway and Oxygen Therapy: Patient Spontanous Breathing  Post-op Pain: none  Post-op Assessment: Post-op Vital signs reviewed, Patient's Cardiovascular Status Stable, Respiratory Function Stable, Patent Airway, No signs of Nausea or vomiting, Adequate PO intake and Pain level controlled  Post-op Vital Signs: Reviewed and stable  Complications: No apparent anesthesia complications

## 2011-10-13 NOTE — Progress Notes (Signed)
  Postpartum day one Vital signs normal Fundus firm Lochia moderate Legs negative No complaints a

## 2011-10-14 MED ORDER — OXYCODONE-ACETAMINOPHEN 5-325 MG PO TABS: 1.0000 | ORAL_TABLET | ORAL | Status: AC | PRN

## 2011-10-14 NOTE — Discharge Summary (Signed)
Obstetric Discharge Summary Reason for Admission: onset of labor Prenatal Procedures: none Intrapartum Procedures: spontaneous vaginal delivery Postpartum Procedures: none Complications-Operative and Postpartum: none Hemoglobin  Date Value Range Status  10/13/2011 11.3* 12.0-15.0 (g/dL) Final     HCT  Date Value Range Status  10/13/2011 32.8* 36.0-46.0 (%) Final    Discharge Diagnoses: Term Pregnancy-delivered  Discharge Information: Date: 10/14/2011 Activity: unrestricted Diet: routine Medications: Percocet Condition: stable Instructions: refer to practice specific booklet Discharge to: home Follow-up Information    Follow up with Tatianna Ibbotson A, MD. Call in 6 weeks.   Contact information:   [address] [place] [address] [phone]          Newborn Data: Live born female  Birth Weight: 8 lb 8.5 oz (3870 g) APGAR: 8, 9  Home with mother.  Kenniyah Sasaki A 10/14/2011, 7:36 AM

## 2012-05-20 ENCOUNTER — Inpatient Hospital Stay (HOSPITAL_COMMUNITY): Payer: Medicaid Other

## 2012-05-20 ENCOUNTER — Inpatient Hospital Stay (HOSPITAL_COMMUNITY)
Admission: AD | Admit: 2012-05-20 | Discharge: 2012-05-20 | Disposition: A | Discharge status: Home / Self Care | Payer: Medicaid Other | Source: Ambulatory Visit | Attending: Obstetrics | Admitting: Obstetrics

## 2012-05-20 ENCOUNTER — Encounter (HOSPITAL_COMMUNITY): Payer: Self-pay

## 2012-05-20 DIAGNOSIS — O99891 Other specified diseases and conditions complicating pregnancy: Secondary | ICD-10-CM | POA: Insufficient documentation

## 2012-05-20 DIAGNOSIS — R109 Unspecified abdominal pain: Secondary | ICD-10-CM

## 2012-05-20 DIAGNOSIS — O26899 Other specified pregnancy related conditions, unspecified trimester: Secondary | ICD-10-CM

## 2012-05-20 LAB — WET PREP, GENITAL: Yeast Wet Prep HPF POC: NONE SEEN

## 2012-05-20 LAB — URINALYSIS, ROUTINE W REFLEX MICROSCOPIC
Bilirubin Urine: NEGATIVE
Hgb urine dipstick: NEGATIVE
Specific Gravity, Urine: 1.01 (ref 1.005–1.030)
pH: 7 (ref 5.0–8.0)

## 2012-05-20 NOTE — MAU Note (Signed)
Pt states has been breastfeeding and using birth control. Had +upt yesterday. States has not had menstrual cycle ever since delivery, taking birth control since December.  Lower abdominal cramping x3 days, denies abnormal vaginal d/c changes or bleeding.

## 2012-05-20 NOTE — MAU Provider Note (Signed)
History     CSN: [id number]  Arrival date and time: 05/20/12 0959   None    HPI "cramping pain for 2 days"  Julia Chang is a 30yo [id number]  who presents with a positive HPT - took yesterday. Pt reports Julia Chang has not had a LMP since delivery. Julia Chang has been on Juvelid OCP since December (last took it on Monday) and has been breastfeeding since November. Pt denies h/o similar issue.   Symptomatically, pt reports a 3d h/o R-sided constant, 7/10 "cramping". Pt denies taking any Rx for it. Pt endorses frequency and urgency but denies any blood loss, loss of fluid or vaginal discharge, dysuria, hematuria, f/c, sob, or cp.    OB History    Grav Para Term Preterm Abortions TAB SAB Ect Mult Living   4 3 3       3       Past Medical History  Diagnosis Date  . HSV-2 infection     last Julia Chang 01/2011    Past Surgical History  Procedure Date  . No past surgeries     Family History  Problem Relation Age of Onset  . Anesthesia problems Neg Hx   . Hypotension Neg Hx   . Malignant hyperthermia Neg Hx   . Pseudochol deficiency Neg Hx   . Other Neg Hx     History  Substance Use Topics  . Smoking status: Never Smoker   . Smokeless tobacco: Never Used  . Alcohol Use: No    Allergies: No Known Allergies  No prescriptions prior to admission    Review of Systems  Constitutional: Negative.   HENT: Negative for ear pain, congestion and sore throat.   Eyes: Negative.   Respiratory: Negative.  Negative for stridor.   Cardiovascular: Negative for chest pain, palpitations and leg swelling.  Gastrointestinal: Positive for nausea (1 wk, no vomiting epsiodes) and abdominal pain (cramping, RLQ, constant). Negative for heartburn, vomiting, diarrhea, constipation and blood in stool.  Genitourinary: Positive for urgency and frequency. Negative for dysuria, hematuria and flank pain.  Musculoskeletal: Negative.   Skin: Negative.   Neurological: Positive for headaches (HA on sunday, frontal,  normal for pt when Julia Chang is in pain, >5 hrs). Negative for dizziness and seizures.  Psychiatric/Behavioral: Negative for depression. The patient is not nervous/anxious.    Physical Exam   Blood pressure 124/72, pulse 68, temperature 98.1 F (36.7 C), temperature source Oral, resp. rate 16, height 5\' 7"  (1.702 m), weight 203 lb 2 oz (92.137 kg), currently breastfeeding.  Physical Exam  Nursing note and vitals reviewed. Constitutional: Julia Chang is oriented to person, place, and time. Julia Chang appears well-developed and well-nourished. No distress.  HENT:  Head: Normocephalic.  Eyes: Pupils are equal, round, and reactive to light.  Cardiovascular: Normal rate, regular rhythm, normal heart sounds and intact distal pulses.   No murmur heard. Respiratory: Effort normal and breath sounds normal. No respiratory distress. Julia Chang has no wheezes.  GI: Soft. Bowel sounds are normal. Julia Chang exhibits no distension and no mass. There is no tenderness. There is no CVA tenderness and no tenderness at McBurney's point.       obese  Genitourinary:       External genitalia without lesions. Creamy discharge vaginal vault. Cervix long and closed. Uterus approximately 10 week size.   Neurological: Julia Chang is alert and oriented to person, place, and time.  Skin: Skin is warm and dry. No rash noted. No erythema.  Psychiatric: Julia Chang has a normal mood and affect. Her behavior  is normal. Judgment and thought content normal.   Results for orders placed during the hospital encounter of 05/20/12 (from the past 24 hour(s))  URINALYSIS, ROUTINE W REFLEX MICROSCOPIC     Status: Normal   Collection Time   05/20/12 10:17 AM      Component Value Range   Color, Urine YELLOW  YELLOW   APPearance CLEAR  CLEAR   Specific Gravity, Urine 1.010  1.005 - 1.030   pH 7.0  5.0 - 8.0   Glucose, UA NEGATIVE  NEGATIVE mg/dL   Hgb urine dipstick NEGATIVE  NEGATIVE   Bilirubin Urine NEGATIVE  NEGATIVE   Ketones, ur NEGATIVE  NEGATIVE mg/dL   Protein, ur  NEGATIVE  NEGATIVE mg/dL   Urobilinogen, UA 0.2  0.0 - 1.0 mg/dL   Nitrite NEGATIVE  NEGATIVE   Leukocytes, UA NEGATIVE  NEGATIVE   Ultrasound today shows a 9 week 2 days IUP with cardiac activity @ 163 bpm and small SCH. EDD 12/21/12.  MAU Course  Procedures  MDM UA w/ POCT Pelvic Exam [place]  Assessment and Plan   Cramping abd pain in first trimester pregnancy  Start prenatal care Return as needed.    Boleslaus Holloway 05/20/2012, 3:05 PM

## 2012-05-20 NOTE — Discharge Instructions (Signed)
Abdominal Pain During Pregnancy Abdominal discomfort is common in pregnancy. Most of the time, it does not cause harm. There are many causes of abdominal pain. Some causes are more serious than others. Some of the causes of abdominal pain in pregnancy are easily diagnosed. Occasionally, the diagnosis takes time to understand. Other times, the cause is not determined. Abdominal pain can be a sign that something is very wrong with the pregnancy, or the pain may have nothing to do with the pregnancy at all. For this reason, always tell your caregiver if you have any abdominal discomfort. CAUSES Common and harmless causes of abdominal pain include:  Constipation.   Excess gas and bloating.   Round ligament pain. This is pain that is felt in the folds of the groin.   The position the baby or placenta is in.   Baby kicks.   Braxton-Hicks contractions. These are mild contractions that do not cause cervical dilation.  Serious causes of abdominal pain include:  Ectopic pregnancy. This happens when a fertilized egg implants outside of the uterus.   Miscarriage.   Preterm labor. This is when labor starts at less than 37 weeks of pregnancy.   Placental abruption. This is when the placenta partially or completely separates from the uterus.   Preeclampsia. This is often associated with high blood pressure and has been referred to as "toxemia in pregnancy."   Uterine or amniotic fluid infections.  Causes unrelated to pregnancy include:  Urinary tract infection.   Gallbladder stones or inflammation.   Hepatitis or other liver illness.   Intestinal problems, stomach flu, food poisoning, or ulcer.   Appendicitis.   Kidney (renal) stones.   Kidney infection (pylonephritis).  HOME CARE INSTRUCTIONS  For mild pain:  Do not have sexual intercourse or put anything in your vagina until your symptoms go away completely.   Get plenty of rest until your pain improves. If your pain does not  improve in 1 hour, call your caregiver.   Drink clear fluids if you feel nauseous. Avoid solid food as long as you are uncomfortable or nauseous.   Only take medicine as directed by your caregiver.   Keep all follow-up appointments with your caregiver.  SEEK IMMEDIATE MEDICAL CARE IF:  You are bleeding, leaking fluid, or passing tissue from the vagina.   You have increasing pain or cramping.   You have persistent vomiting.   You have painful or bloody urination.   You have a fever.   You notice a decrease in your baby's movements.   You have extreme weakness or feel faint.   You have shortness of breath, with or without abdominal pain.   You develop a severe headache with abdominal pain.   You have abnormal vaginal discharge with abdominal pain.   You have persistent diarrhea.   You have abdominal pain that continues even after rest, or gets worse.  MAKE SURE YOU:   Understand these instructions.   Will watch your condition.   Will get help right away if you are not doing well or get worse.  Document Released: 11/23/2005 Document Revised: 11/12/2011 Document Reviewed: 06/19/2011 Exit[employer] Patient Information 2012 [employer].Abdominal Pain During Pregnancy Abdominal discomfort is common in pregnancy. Most of the time, it does not cause harm. There are many causes of abdominal pain. Some causes are more serious than others. Some of the causes of abdominal pain in pregnancy are easily diagnosed. Occasionally, the diagnosis takes time to understand. Other times, the cause is not determined.   Abdominal pain can be a sign that something is very wrong with the pregnancy, or the pain may have nothing to do with the pregnancy at all. For this reason, always tell your caregiver if you have any abdominal discomfort. CAUSES Common and harmless causes of abdominal pain include:  Constipation.   Excess gas and bloating.   Round ligament pain. This is pain that is felt in the  folds of the groin.   The position the baby or placenta is in.   Baby kicks.   Braxton-Hicks contractions. These are mild contractions that do not cause cervical dilation.  Serious causes of abdominal pain include:  Ectopic pregnancy. This happens when a fertilized egg implants outside of the uterus.   Miscarriage.   Preterm labor. This is when labor starts at less than 37 weeks of pregnancy.   Placental abruption. This is when the placenta partially or completely separates from the uterus.   Preeclampsia. This is often associated with high blood pressure and has been referred to as "toxemia in pregnancy."   Uterine or amniotic fluid infections.  Causes unrelated to pregnancy include:  Urinary tract infection.   Gallbladder stones or inflammation.   Hepatitis or other liver illness.   Intestinal problems, stomach flu, food poisoning, or ulcer.   Appendicitis.   Kidney (renal) stones.   Kidney infection (pylonephritis).  HOME CARE INSTRUCTIONS  For mild pain:  Do not have sexual intercourse or put anything in your vagina until your symptoms go away completely.   Get plenty of rest until your pain improves. If your pain does not improve in 1 hour, call your caregiver.   Drink clear fluids if you feel nauseous. Avoid solid food as long as you are uncomfortable or nauseous.   Only take medicine as directed by your caregiver.   Keep all follow-up appointments with your caregiver.  SEEK IMMEDIATE MEDICAL CARE IF:  You are bleeding, leaking fluid, or passing tissue from the vagina.   You have increasing pain or cramping.   You have persistent vomiting.   You have painful or bloody urination.   You have a fever.   You notice a decrease in your baby's movements.   You have extreme weakness or feel faint.   You have shortness of breath, with or without abdominal pain.   You develop a severe headache with abdominal pain.   You have abnormal vaginal discharge  with abdominal pain.   You have persistent diarrhea.   You have abdominal pain that continues even after rest, or gets worse.  MAKE SURE YOU:   Understand these instructions.   Will watch your condition.   Will get help right away if you are not doing well or get worse.  Document Released: 11/23/2005 Document Revised: 11/12/2011 Document Reviewed: 06/19/2011 Exit[employer] Patient Information 2012 [employer]. 

## 2012-05-21 LAB — GC/CHLAMYDIA PROBE AMP, GENITAL
Chlamydia, DNA Probe: NEGATIVE
GC Probe Amp, Genital: NEGATIVE

## 2012-07-15 LAB — OB RESULTS CONSOLE HIV ANTIBODY (ROUTINE TESTING)
HIV: NONREACTIVE
HIV: NONREACTIVE

## 2012-07-15 LAB — OB RESULTS CONSOLE GC/CHLAMYDIA
Chlamydia: NEGATIVE
Gonorrhea: NEGATIVE

## 2012-07-15 LAB — OB RESULTS CONSOLE RPR
RPR: NONREACTIVE
RPR: NONREACTIVE

## 2012-07-15 LAB — OB RESULTS CONSOLE ABO/RH: RH Type: POSITIVE

## 2012-08-03 ENCOUNTER — Other Ambulatory Visit (HOSPITAL_COMMUNITY): Payer: Self-pay | Admitting: Obstetrics

## 2012-08-03 DIAGNOSIS — O4400 Placenta previa specified as without hemorrhage, unspecified trimester: Secondary | ICD-10-CM

## 2012-08-05 ENCOUNTER — Ambulatory Visit (HOSPITAL_COMMUNITY): Payer: Medicaid Other

## 2012-08-12 ENCOUNTER — Ambulatory Visit (HOSPITAL_COMMUNITY)
Admission: RE | Admit: 2012-08-12 | Discharge: 2012-08-12 | Disposition: A | Discharge status: Home / Self Care | Payer: Medicaid Other | Source: Ambulatory Visit | Attending: Obstetrics | Admitting: Obstetrics

## 2012-08-12 DIAGNOSIS — O4400 Placenta previa specified as without hemorrhage, unspecified trimester: Secondary | ICD-10-CM

## 2012-09-26 LAB — OB RESULTS CONSOLE GBS: GBS: POSITIVE

## 2012-12-07 NOTE — L&D Delivery Note (Signed)
And and and andDelivery Note At 6:19 AM a viable female was delivered via Vaginal, Spontaneous Delivery (Presentation: ;  ).  APGAR: 9, ; weight .   Placenta status: , .  Cord:  with the following complications: .  Cord pH: not done  Anesthesia:   Episiotomy:  Lacerations:  Suture Repair: 2.0 Est. Blood Loss (mL):   Mom to postpartum.  Baby to nursery-stable.  MARSHALL,BERNARD A 12/21/2012, 6:27 AM

## 2012-12-20 ENCOUNTER — Encounter (HOSPITAL_COMMUNITY): Payer: Self-pay | Admitting: *Deleted

## 2012-12-20 ENCOUNTER — Encounter (HOSPITAL_COMMUNITY): Payer: Self-pay

## 2012-12-20 ENCOUNTER — Inpatient Hospital Stay (HOSPITAL_COMMUNITY)
Admission: RE | Admit: 2012-12-20 | Discharge: 2012-12-23 | DRG: 775 | Disposition: A | Discharge status: Home / Self Care | Payer: Medicaid Other | Source: Ambulatory Visit | Attending: Obstetrics | Admitting: Obstetrics

## 2012-12-20 ENCOUNTER — Telehealth (HOSPITAL_COMMUNITY): Payer: Self-pay | Admitting: *Deleted

## 2012-12-20 LAB — CBC
MCH: 29.9 pg (ref 26.0–34.0)
MCHC: 34.7 g/dL (ref 30.0–36.0)
MCV: 86.1 fL (ref 78.0–100.0)
Platelets: 311 10*3/uL (ref 150–400)
RDW: 14 % (ref 11.5–15.5)

## 2012-12-20 MED ORDER — OXYTOCIN BOLUS FROM INFUSION
500.0000 mL | INTRAVENOUS | Status: DC
  Administered 2012-12-21: 500 mL via INTRAVENOUS

## 2012-12-20 MED ORDER — LACTATED RINGERS IV SOLN
INTRAVENOUS | Status: DC
  Administered 2012-12-20: 20:00:00 via INTRAVENOUS

## 2012-12-20 MED ORDER — CITRIC ACID-SODIUM CITRATE 334-500 MG/5ML PO SOLN: 30.0000 mL | ORAL | Status: DC | PRN

## 2012-12-20 MED ORDER — LIDOCAINE HCL (PF) 1 % IJ SOLN: 30.0000 mL | INTRAMUSCULAR | Status: DC | PRN

## 2012-12-20 MED ORDER — OXYTOCIN 40 UNITS IN LACTATED RINGERS INFUSION - SIMPLE MED
1.0000 m[IU]/min | INTRAVENOUS | Status: DC
  Administered 2012-12-20: 2 m[IU]/min via INTRAVENOUS
  Filled 2012-12-20: qty 1000

## 2012-12-20 MED ORDER — IBUPROFEN 600 MG PO TABS: 600.0000 mg | ORAL_TABLET | Freq: Four times a day (QID) | ORAL | Status: DC | PRN

## 2012-12-20 MED ORDER — OXYTOCIN 40 UNITS IN LACTATED RINGERS INFUSION - SIMPLE MED: 62.5000 mL/h | INTRAVENOUS | Status: DC

## 2012-12-20 MED ORDER — TERBUTALINE SULFATE 1 MG/ML IJ SOLN: 0.2500 mg | Freq: Once | INTRAMUSCULAR | Status: AC | PRN

## 2012-12-20 MED ORDER — OXYCODONE-ACETAMINOPHEN 5-325 MG PO TABS: 1.0000 | ORAL_TABLET | ORAL | Status: DC | PRN

## 2012-12-20 MED ORDER — FLEET ENEMA 7-19 GM/118ML RE ENEM: 1.0000 | ENEMA | RECTAL | Status: DC | PRN

## 2012-12-20 MED ORDER — LACTATED RINGERS IV SOLN: 500.0000 mL | INTRAVENOUS | Status: DC | PRN

## 2012-12-20 MED ORDER — ACETAMINOPHEN 325 MG PO TABS: 650.0000 mg | ORAL_TABLET | ORAL | Status: DC | PRN

## 2012-12-20 MED ORDER — BUTORPHANOL TARTRATE 1 MG/ML IJ SOLN
1.0000 mg | INTRAMUSCULAR | Status: DC | PRN
  Administered 2012-12-21: 1 mg via INTRAVENOUS
  Filled 2012-12-20: qty 1

## 2012-12-20 MED ORDER — ONDANSETRON HCL 4 MG/2ML IJ SOLN
4.0000 mg | Freq: Four times a day (QID) | INTRAMUSCULAR | Status: DC | PRN
  Administered 2012-12-21: 4 mg via INTRAVENOUS
  Filled 2012-12-20: qty 2

## 2012-12-20 NOTE — Telephone Encounter (Signed)
Preadmission screen  

## 2012-12-21 ENCOUNTER — Encounter (HOSPITAL_COMMUNITY): Payer: Self-pay | Admitting: Anesthesiology

## 2012-12-21 ENCOUNTER — Inpatient Hospital Stay (HOSPITAL_COMMUNITY): Payer: Medicaid Other | Admitting: Anesthesiology

## 2012-12-21 ENCOUNTER — Encounter (HOSPITAL_COMMUNITY): Payer: Self-pay

## 2012-12-21 LAB — RPR: RPR Ser Ql: NONREACTIVE

## 2012-12-21 MED ORDER — OXYCODONE-ACETAMINOPHEN 5-325 MG PO TABS
1.0000 | ORAL_TABLET | ORAL | Status: DC | PRN
  Administered 2012-12-21 – 2012-12-23 (×5): 1 via ORAL
  Filled 2012-12-21 (×5): qty 1

## 2012-12-21 MED ORDER — PHENYLEPHRINE 40 MCG/ML (10ML) SYRINGE FOR IV PUSH (FOR BLOOD PRESSURE SUPPORT): 80.0000 ug | PREFILLED_SYRINGE | INTRAVENOUS | Status: DC | PRN

## 2012-12-21 MED ORDER — LACTATED RINGERS IV SOLN
500.0000 mL | Freq: Once | INTRAVENOUS | Status: AC
  Administered 2012-12-21: 500 mL via INTRAVENOUS

## 2012-12-21 MED ORDER — ONDANSETRON HCL 4 MG/2ML IJ SOLN: 4.0000 mg | INTRAMUSCULAR | Status: DC | PRN

## 2012-12-21 MED ORDER — FENTANYL 2.5 MCG/ML BUPIVACAINE 1/10 % EPIDURAL INFUSION (WH - ANES)
14.0000 mL/h | INTRAMUSCULAR | Status: DC
  Filled 2012-12-21: qty 125

## 2012-12-21 MED ORDER — ONDANSETRON HCL 4 MG PO TABS: 4.0000 mg | ORAL_TABLET | ORAL | Status: DC | PRN

## 2012-12-21 MED ORDER — EPHEDRINE 5 MG/ML INJ
10.0000 mg | INTRAVENOUS | Status: DC | PRN
  Filled 2012-12-21: qty 4

## 2012-12-21 MED ORDER — PHENYLEPHRINE 40 MCG/ML (10ML) SYRINGE FOR IV PUSH (FOR BLOOD PRESSURE SUPPORT)
80.0000 ug | PREFILLED_SYRINGE | INTRAVENOUS | Status: DC | PRN
  Filled 2012-12-21: qty 5

## 2012-12-21 MED ORDER — ZOLPIDEM TARTRATE 5 MG PO TABS: 5.0000 mg | ORAL_TABLET | Freq: Every evening | ORAL | Status: DC | PRN

## 2012-12-21 MED ORDER — FENTANYL 2.5 MCG/ML BUPIVACAINE 1/10 % EPIDURAL INFUSION (WH - ANES)
INTRAMUSCULAR | Status: DC | PRN
  Administered 2012-12-21: 14 mL/h via EPIDURAL

## 2012-12-21 MED ORDER — SIMETHICONE 80 MG PO CHEW: 80.0000 mg | CHEWABLE_TABLET | ORAL | Status: DC | PRN

## 2012-12-21 MED ORDER — DIPHENHYDRAMINE HCL 50 MG/ML IJ SOLN: 12.5000 mg | INTRAMUSCULAR | Status: DC | PRN

## 2012-12-21 MED ORDER — SENNOSIDES-DOCUSATE SODIUM 8.6-50 MG PO TABS
2.0000 | ORAL_TABLET | Freq: Every day | ORAL | Status: DC
  Administered 2012-12-21 – 2012-12-22 (×2): 2 via ORAL

## 2012-12-21 MED ORDER — IBUPROFEN 600 MG PO TABS
600.0000 mg | ORAL_TABLET | Freq: Four times a day (QID) | ORAL | Status: DC
  Administered 2012-12-21 – 2012-12-23 (×9): 600 mg via ORAL
  Filled 2012-12-21 (×8): qty 1

## 2012-12-21 MED ORDER — DIBUCAINE 1 % RE OINT: 1.0000 "application " | TOPICAL_OINTMENT | RECTAL | Status: DC | PRN

## 2012-12-21 MED ORDER — BENZOCAINE-MENTHOL 20-0.5 % EX AERO
1.0000 "application " | INHALATION_SPRAY | CUTANEOUS | Status: DC | PRN
  Administered 2012-12-21: 1 via TOPICAL
  Filled 2012-12-21: qty 56

## 2012-12-21 MED ORDER — PRENATAL MULTIVITAMIN CH
1.0000 | ORAL_TABLET | Freq: Every day | ORAL | Status: DC
  Administered 2012-12-22 – 2012-12-23 (×2): 1 via ORAL
  Filled 2012-12-21 (×2): qty 1

## 2012-12-21 MED ORDER — DIPHENHYDRAMINE HCL 25 MG PO CAPS: 25.0000 mg | ORAL_CAPSULE | Freq: Four times a day (QID) | ORAL | Status: DC | PRN

## 2012-12-21 MED ORDER — LIDOCAINE HCL (PF) 1 % IJ SOLN
INTRAMUSCULAR | Status: DC | PRN
  Administered 2012-12-21 (×2): 4 mL

## 2012-12-21 MED ORDER — WITCH HAZEL-GLYCERIN EX PADS: 1.0000 "application " | MEDICATED_PAD | CUTANEOUS | Status: DC | PRN

## 2012-12-21 MED ORDER — TETANUS-DIPHTH-ACELL PERTUSSIS 5-2.5-18.5 LF-MCG/0.5 IM SUSP: 0.5000 mL | Freq: Once | INTRAMUSCULAR | Status: DC

## 2012-12-21 MED ORDER — LANOLIN HYDROUS EX OINT: TOPICAL_OINTMENT | CUTANEOUS | Status: DC | PRN

## 2012-12-21 MED ORDER — EPHEDRINE 5 MG/ML INJ: 10.0000 mg | INTRAVENOUS | Status: DC | PRN

## 2012-12-21 MED ORDER — FERROUS SULFATE 325 (65 FE) MG PO TABS
325.0000 mg | ORAL_TABLET | Freq: Two times a day (BID) | ORAL | Status: DC
  Administered 2012-12-21 – 2012-12-23 (×4): 325 mg via ORAL
  Filled 2012-12-21 (×3): qty 1

## 2012-12-21 NOTE — Anesthesia Preprocedure Evaluation (Signed)

## 2012-12-21 NOTE — Anesthesia Procedure Notes (Signed)
Epidural Patient location during procedure: OB Start time: 12/21/2012 2:25 AM  Staffing Anesthesiologist: Nicholad Kautzman A. Performed by: anesthesiologist   Preanesthetic Checklist Completed: patient identified, site marked, surgical consent, pre-op evaluation, timeout performed, IV checked, risks and benefits discussed and monitors and equipment checked  Epidural Patient position: sitting Prep: site prepped and draped and DuraPrep Patient monitoring: continuous pulse ox and blood pressure Approach: midline Injection technique: LOR air  Needle:  Needle type: Tuohy  Needle gauge: 17 G Needle length: 9 cm and 9 Needle insertion depth: 5 cm cm Catheter type: closed end flexible Catheter size: 19 Gauge Catheter at skin depth: 10 cm Test dose: negative and Other  Assessment Events: blood not aspirated, injection not painful, no injection resistance, negative IV test and no paresthesia  Additional Notes Patient identified. Risks and benefits discussed including failed block, incomplete  Pain control, post dural puncture headache, nerve damage, paralysis, blood pressure Changes, nausea, vomiting, reactions to medications-both toxic and allergic and post Partum back pain. All questions were answered. Patient expressed understanding and wished to proceed. Sterile technique was used throughout procedure. Epidural site was Dressed with sterile barrier dressing. No paresthesias, signs of intravascular injection Or signs of intrathecal spread were encountered.  Patient was more comfortable after the epidural was dosed. Please see RN's note for documentation of vital signs and FHR which are stable.

## 2012-12-21 NOTE — H&P (Signed)
This is Dr. Francoise Ceo dictating the history and physical on Julia Chang she's a 32 year old gravida 4 para 3003 at 40 weeks her EDC is 12/21/2012 negative GBS and she desired induction she's on low-dose Pitocin and her cervix is 2 cm 70% with the vertex at -2-3 station amniotomy performed the fluids clear Past medical history negative Past surgical history negative Social history negative System review noncontributory Physical exam revealed a well-developed female in labor HEENT negative Lungs clear to P&A Heart regular rhythm no murmurs no gallops Abdomen term Pelvic as described above Extremities negative

## 2012-12-21 NOTE — Progress Notes (Signed)
Pt still complaining of shortness of breathe but is sleepy from iv pain medication, pulse ox reading 100% on room air, lung sounds clear

## 2012-12-21 NOTE — Anesthesia Postprocedure Evaluation (Signed)
  Anesthesia Post-op Note  Patient: Julia Chang  Procedure(s) Performed: * No procedures listed *  Patient Location: PACU  Anesthesia Type:Epidural  Level of Consciousness: awake, alert , oriented and patient cooperative  Airway and Oxygen Therapy: Patient Spontanous Breathing  Post-op Pain: mild  Post-op Assessment: Patient's Cardiovascular Status Stable, Respiratory Function Stable, Patent Airway, No signs of Nausea or vomiting, Adequate PO intake and Pain level controlled  Post-op Vital Signs: Reviewed and stable  Complications: No apparent anesthesia complications

## 2012-12-21 NOTE — Progress Notes (Signed)
Pt called out c/o of difficulty breathing, VS as above.

## 2012-12-22 ENCOUNTER — Inpatient Hospital Stay (HOSPITAL_COMMUNITY): Admission: RE | Admit: 2012-12-22 | Payer: Medicaid Other | Source: Ambulatory Visit

## 2012-12-22 LAB — CBC
Hemoglobin: 10.6 g/dL — ABNORMAL LOW (ref 12.0–15.0)
MCHC: 35.2 g/dL (ref 30.0–36.0)

## 2012-12-22 NOTE — Progress Notes (Signed)
Patient ID: Julia Chang, female   DOB: [birth year], 32 y.o.   MRN: [id number] Postpartum day one Vital signs normal Fundus firm Lochia moderate Legs negative Doing well

## 2012-12-22 NOTE — Progress Notes (Signed)
Ur chart review completed.  

## 2012-12-23 MED ORDER — OXYCODONE-ACETAMINOPHEN 5-325 MG PO TABS: 1.0000 | ORAL_TABLET | ORAL | Status: DC | PRN

## 2012-12-23 MED ORDER — MEDROXYPROGESTERONE ACETATE 150 MG/ML IM SUSP: 150.0000 mg | INTRAMUSCULAR | Status: DC

## 2012-12-23 MED ORDER — IBUPROFEN 600 MG PO TABS: 600.0000 mg | ORAL_TABLET | Freq: Four times a day (QID) | ORAL | Status: DC | PRN

## 2012-12-23 NOTE — Progress Notes (Signed)
Post Partum Day 2 Subjective: no complaints  Objective: Blood pressure 118/78, pulse 105, temperature 98 F (36.7 C), temperature source Oral, resp. rate 18, height 5\' 7"  (1.702 m), weight 214 lb (97.07 kg), SpO2 97.00%, unknown if currently breastfeeding.  Physical Exam:  General: alert and no distress Lochia: appropriate Uterine Fundus: firm Incision: healing well DVT Evaluation: No evidence of DVT seen on physical exam.   Basename 12/22/12 0620 12/20/12 2015  HGB 10.6* 11.2*  HCT 30.1* 32.3*    Assessment/Plan: Discharge home   LOS: 3 days   Jahleel Stroschein A 12/23/2012, 10:05 AM

## 2012-12-23 NOTE — Progress Notes (Signed)
CSW met with pt briefly to discuss PP depression signs & symptoms.  Pt denies any depression history or PP depression.  She told CSW that she was tired.  Pt was preparing to discharge but receptive to information discussed.  She has good family support & all the necessary supplies.  Pt appears to be appropriate.  PP depression literature given.

## 2012-12-23 NOTE — Discharge Summary (Signed)
Obstetric Discharge Summary Reason for Admission: induction of labor Prenatal Procedures: ultrasound Intrapartum Procedures: spontaneous vaginal delivery Postpartum Procedures: none Complications-Operative and Postpartum: none Hemoglobin  Date Value Range Status  12/22/2012 10.6* 12.0 - 15.0 g/dL Final     HCT  Date Value Range Status  12/22/2012 30.1* 36.0 - 46.0 % Final    Physical Exam:  General: alert and no distress Lochia: appropriate Uterine Fundus: firm Incision: healing well DVT Evaluation: No evidence of DVT seen on physical exam.  Discharge Diagnoses: Term Pregnancy-delivered  Discharge Information: Date: 12/23/2012 Activity: pelvic rest Diet: routine Medications: PNV, Ibuprofen, Colace and Percocet Condition: stable Instructions: refer to practice specific booklet Discharge to: home Follow-up Information    Follow up with MARSHALL,BERNARD A, MD. Schedule an appointment as soon as possible for a visit in 6 weeks.   Contact information:   [address] [place] [address] [phone]          Newborn Data: Live born female  Birth Weight: 8 lb 7.8 oz (3850 g) APGAR: 9, 9  Home with mother.  Julia Chang A 12/23/2012, 10:07 AM

## 2012-12-28 ENCOUNTER — Ambulatory Visit (HOSPITAL_COMMUNITY): Payer: Medicaid Other

## 2014-01-28 ENCOUNTER — Emergency Department (HOSPITAL_COMMUNITY)
Admission: EM | Admit: 2014-01-28 | Discharge: 2014-01-28 | Disposition: A | Discharge status: Home / Self Care | Payer: Medicaid Other | Source: Home / Self Care | Attending: Family Medicine | Admitting: Family Medicine

## 2014-01-28 ENCOUNTER — Encounter (HOSPITAL_COMMUNITY): Payer: Self-pay | Admitting: Emergency Medicine

## 2014-01-28 DIAGNOSIS — N390 Urinary tract infection, site not specified: Secondary | ICD-10-CM

## 2014-01-28 LAB — POCT URINALYSIS DIP (DEVICE)
BILIRUBIN URINE: NEGATIVE
GLUCOSE, UA: NEGATIVE mg/dL
Ketones, ur: NEGATIVE mg/dL
NITRITE: NEGATIVE
Protein, ur: 100 mg/dL — AB
Specific Gravity, Urine: 1.02 (ref 1.005–1.030)
Urobilinogen, UA: 0.2 mg/dL (ref 0.0–1.0)
pH: 7 (ref 5.0–8.0)

## 2014-01-28 LAB — POCT PREGNANCY, URINE: Preg Test, Ur: NEGATIVE

## 2014-01-28 MED ORDER — NITROFURANTOIN MONOHYD MACRO 100 MG PO CAPS: 100.0000 mg | ORAL_CAPSULE | Freq: Two times a day (BID) | ORAL | Status: DC

## 2014-01-28 NOTE — ED Notes (Signed)
C/o uti since Friday states she has seen blood in urine  Does have pressure, hurts when urinating and has frequency  No treatments done

## 2014-01-28 NOTE — ED Provider Notes (Signed)
CSN: [id number]     Arrival date & time 01/28/14  1039 History   First MD Initiated Contact with Patient 01/28/14 1059     Chief Complaint  Patient presents with  . Urinary Tract Infection     (Consider location/radiation/quality/duration/timing/severity/associated sxs/prior Treatment) HPI Comments: Patient presents with a 2 day history of urinary frequency with suprapubic discomfort, lower back discomfort and hematuria. Denies fever, N/V, or flank pain. No vaginal bleeding or discharge.   Patient is a 33 y.o. female presenting with urinary tract infection. The history is provided by the patient.  Urinary Tract Infection    Past Medical History  Diagnosis Date  . 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 No past surgeries     Family History  Problem Relation Age of Onset  . Anesthesia problems Neg Hx   . Hypotension Neg Hx   . Malignant hyperthermia Neg Hx   . Pseudochol deficiency Neg Hx   . Other Neg Hx    History  Substance Use Topics  . Smoking status: Never Smoker   . Smokeless tobacco: Never Used  . Alcohol Use: No   OB History   Grav Para Term Preterm Abortions TAB SAB Ect Mult Living   4 4 4       4      Review of Systems  All other systems reviewed and are negative.      Allergies  Review of patient's allergies indicates no known allergies.  Home Medications   Current Outpatient Rx  Name  Route  Sig  Dispense  Refill  . ibuprofen (ADVIL,MOTRIN) 600 MG tablet   Oral   Take 1 tablet (600 mg total) by mouth every 6 (six) hours as needed for pain.   30 tablet   5   . medroxyPROGESTERone (DEPO-PROVERA) 150 MG/ML injection   Intramuscular   Inject 1 mL (150 mg total) into the muscle every 3 (three) months.   1 mL   3   . nitrofurantoin, macrocrystal-monohydrate, (MACROBID) 100 MG capsule   Oral   Take 1 capsule (100 mg total) by mouth 2 (two) times daily. X 5 days   10 capsule   0   .  oxyCODONE-acetaminophen (PERCOCET/ROXICET) 5-325 MG per tablet   Oral   Take 1-2 tablets by mouth every 4 (four) hours as needed (moderate - severe pain).   40 tablet   0    BP 119/75  Pulse 90  Temp(Src) 98.8 F (37.1 C) (Oral)  Resp 16  SpO2 98%  LMP 01/15/2014 Physical Exam  Nursing note and vitals reviewed. Constitutional: She is oriented to person, place, and time. She appears well-developed.  HENT:  Head: Normocephalic and atraumatic.  Eyes: Conjunctivae are normal. No scleral icterus.  Cardiovascular: Normal rate and regular rhythm.   Pulmonary/Chest: Effort normal and breath sounds normal. No respiratory distress.  Abdominal: Soft. Bowel sounds are normal. She exhibits no distension. There is no tenderness. There is no CVA tenderness.  Musculoskeletal: Normal range of motion.  Neurological: She is alert and oriented to person, place, and time.  Skin: Skin is warm and dry. No rash noted.  Psychiatric: She has a normal mood and affect. Her behavior is normal.    ED Course  Procedures (including critical care time) Labs Review Labs Reviewed  POCT URINALYSIS DIP (DEVICE) - Abnormal; Notable for the following:    Hgb urine dipstick LARGE (*)    Protein,  ur 100 (*)    Leukocytes, UA MODERATE (*)    All other components within normal limits  URINE CULTURE  POCT PREGNANCY, URINE   Imaging Review No results found.    MDM   Final diagnoses:  UTI (lower urinary tract infection)  UTI: will send specimen for C&S and initiate treatment with macrobid. Advise plenty of water along with meds as prescribed and follow up if no improvement.     Jess Barters [place], [place] 01/28/14 1148

## 2014-01-30 ENCOUNTER — Telehealth (HOSPITAL_COMMUNITY): Payer: Self-pay | Admitting: Family Medicine

## 2014-01-30 LAB — URINE CULTURE: Special Requests: NORMAL

## 2014-01-30 MED ORDER — CEPHALEXIN 500 MG PO CAPS: 500.0000 mg | ORAL_CAPSULE | Freq: Two times a day (BID) | ORAL | Status: DC

## 2014-01-30 NOTE — ED Provider Notes (Signed)
Medical screening examination/treatment/procedure(s) were performed by a resident physician or non-physician practitioner and as the supervising physician I was immediately available for consultation/collaboration.  Donne Baley, MD    Ellieana Dolecki S Pamalee Marcoe, MD 01/30/14 0729 

## 2014-01-30 NOTE — ED Notes (Signed)
Urine culture: 20,000 colonies Klebsiella Pneumoniae.  Pt. treated with Macrobid-64 intermediate.  Lab shown to Dr. Denyse Amassorey.  He e-prescribed Keflex to pt.'s pharmacy. I called pt. and left a message to call. Julia Chang, Julia Chang 01/30/2014

## 2014-01-30 NOTE — ED Notes (Signed)
Urine C/S showing Klebsiella. Intermediate resistant to Macrobid. Switching to Keflex Nurse to call patient  Rodolph BongEvan S Jasper Ruminski, MD 01/30/14 [phone]

## 2014-01-31 ENCOUNTER — Telehealth (HOSPITAL_COMMUNITY): Payer: Self-pay | Admitting: *Deleted

## 2014-02-02 NOTE — ED Notes (Signed)
Call from patient regarding [place]. Has already picked up her new Rx and has started same

## 2014-10-08 ENCOUNTER — Encounter (HOSPITAL_COMMUNITY): Payer: Self-pay | Admitting: Emergency Medicine

## 2016-12-07 NOTE — L&D Delivery Note (Signed)
Patient is 36 y.o. [id number] 3039w4d admitted for SOL. AROM at 1918.  Prenatal course also complicated by AMA.  Delivery Note At 10:08 PM a viable female was delivered via Vaginal, Spontaneous Delivery (Presentation: LOA).  APGAR: 9, 9; weight pending.   Placenta status: Intact.  Cord: 3V with the following complications: None.  Cord pH: N/A  Anesthesia:  Epidural Episiotomy: None Lacerations: None Est. Blood Loss (mL): 100  Mom to postpartum.  Baby to Couplet care / Skin to Skin.  Upon arrival patient was complete and pushing. She pushed with good maternal effort to deliver a viable infant in cephalic, LOA position. No nuchal cord present. Baby delivered without difficulty, was noted to have good tone and place on maternal abdomen for oral suctioning, drying and stimulation. Delayed cord clamping performed. Placenta delivered spontaneously with gentle cord traction. Fundus firm with massage and Pitocin. Perineum inspected and found to have good hemostasis achieved. Counts of sharps, instruments, and lap pads were all correct.   Caryl AdaJazma Gregrey Bloyd, DO OB Fellow Faculty Practice, [facility] 08/09/2017, 10:21 PM

## 2017-01-19 ENCOUNTER — Ambulatory Visit (INDEPENDENT_AMBULATORY_CARE_PROVIDER_SITE_OTHER): Payer: Medicaid Other

## 2017-01-19 DIAGNOSIS — Z3201 Encounter for pregnancy test, result positive: Secondary | ICD-10-CM | POA: Diagnosis not present

## 2017-01-19 DIAGNOSIS — Z3200 Encounter for pregnancy test, result unknown: Secondary | ICD-10-CM

## 2017-01-19 LAB — POCT URINE PREGNANCY: Preg Test, Ur: POSITIVE — AB

## 2017-01-19 NOTE — Progress Notes (Signed)
Patient is here for a Pregnancy Test. UPT-POSITIVE.  LMP 11/12/2016; EDD 08/19/2017. Patient advised to make appointment for Prenatal care.

## 2017-02-02 ENCOUNTER — Encounter: Payer: Self-pay | Admitting: Certified Nurse Midwife

## 2017-02-02 ENCOUNTER — Ambulatory Visit (INDEPENDENT_AMBULATORY_CARE_PROVIDER_SITE_OTHER): Payer: Medicaid Other | Admitting: Certified Nurse Midwife

## 2017-02-02 ENCOUNTER — Other Ambulatory Visit (HOSPITAL_COMMUNITY)
Admission: RE | Admit: 2017-02-02 | Discharge: 2017-02-02 | Disposition: A | Discharge status: Home / Self Care | Payer: Medicaid Other | Source: Ambulatory Visit | Attending: Certified Nurse Midwife | Admitting: Certified Nurse Midwife

## 2017-02-02 VITALS — BP 128/80 | HR 88 | Wt 202.0 lb

## 2017-02-02 DIAGNOSIS — Z01419 Encounter for gynecological examination (general) (routine) without abnormal findings: Secondary | ICD-10-CM | POA: Insufficient documentation

## 2017-02-02 DIAGNOSIS — Z3481 Encounter for supervision of other normal pregnancy, first trimester: Secondary | ICD-10-CM

## 2017-02-02 DIAGNOSIS — Z1151 Encounter for screening for human papillomavirus (HPV): Secondary | ICD-10-CM | POA: Diagnosis not present

## 2017-02-02 DIAGNOSIS — O09521 Supervision of elderly multigravida, first trimester: Secondary | ICD-10-CM

## 2017-02-02 DIAGNOSIS — O09529 Supervision of elderly multigravida, unspecified trimester: Secondary | ICD-10-CM | POA: Insufficient documentation

## 2017-02-02 DIAGNOSIS — G44229 Chronic tension-type headache, not intractable: Secondary | ICD-10-CM | POA: Insufficient documentation

## 2017-02-02 DIAGNOSIS — Z3490 Encounter for supervision of normal pregnancy, unspecified, unspecified trimester: Secondary | ICD-10-CM

## 2017-02-02 DIAGNOSIS — O219 Vomiting of pregnancy, unspecified: Secondary | ICD-10-CM | POA: Insufficient documentation

## 2017-02-02 DIAGNOSIS — B009 Herpesviral infection, unspecified: Secondary | ICD-10-CM | POA: Insufficient documentation

## 2017-02-02 MED ORDER — BUTALBITAL-APAP-CAFFEINE 50-325-40 MG PO TABS: 1.0000 | ORAL_TABLET | Freq: Four times a day (QID) | ORAL | 4 refills | Status: DC | PRN

## 2017-02-02 MED ORDER — DOXYLAMINE-PYRIDOXINE 10-10 MG PO TBEC: DELAYED_RELEASE_TABLET | ORAL | 4 refills | Status: DC

## 2017-02-02 MED ORDER — PRENATE PIXIE 10-0.6-0.4-200 MG PO CAPS: 1.0000 | ORAL_CAPSULE | Freq: Every day | ORAL | 12 refills | Status: DC

## 2017-02-02 NOTE — Progress Notes (Signed)
Subjective:    Julia Chang is being seen today for her first obstetrical visit. LMP was 11/12/2016. This is a planned pregnancy. She is at 7316w5d gestation. Her obstetrical history is significant for advanced maternal age. PMH of HSV no tx at this time, last PAP was 2015 and normal,  Relationship with FOB: spouse, living together. Patient does intend to breast feed. Pregnancy history fully reviewed. Pt stated she has had nausea but no vomiting and needs something for it, pt also needs PNV not currently taking them, pt endorses having bad HA very often and about 4 years ago her pcp would give her hydrocodone to relief the pain. Pt inquiring about pain meds. Pt denies having aura, photosensitivity, or nausea with Ha. Currently pt denies Ha, v, d, rashes, fever, cp or sob.   The information documented in the HPI was reviewed and verified.  Menstrual History: OB History    Gravida Para Term Preterm AB Living   5 4 4     4    SAB TAB Ectopic Multiple Live Births           4      Patient's last menstrual period was 11/12/2016.    Past Medical History:  Diagnosis Date  . 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    Past Surgical History:  Procedure Laterality Date  . NO PAST SURGERIES       (Not in a hospital admission) No Known Allergies  Social History  Substance Use Topics  . Smoking status: Never Smoker  . Smokeless tobacco: Never Used  . Alcohol use No    Family History  Problem Relation Age of Onset  . Diabetes Mother   . Anesthesia problems Neg Hx   . Hypotension Neg Hx   . Malignant hyperthermia Neg Hx   . Pseudochol deficiency Neg Hx   . Other Neg Hx      Review of Systems Constitutional: negative for weight loss Gastrointestinal: + for vomiting Genitourinary:negative for genital lesions and vaginal discharge and dysuria Musculoskeletal:negative for back pain Behavioral/Psych: negative for abusive relationship, depression, illegal drug usage and tobacco use     Objective:  FHT 150, Fundal size consistent with [redacted] week gestation  BP 128/80   Pulse 88   Wt 202 lb (91.6 kg)   LMP 11/12/2016   BMI 31.64 kg/m  General Appearance:    Alert, cooperative, no distress, appears stated age  Head:    Normocephalic, without obvious abnormality, atraumatic  Eyes:    PERRL, conjunctiva/corneas clear, EOM's intact, fundi    benign, both eyes  Ears:    Normal TM's and external ear canals, both ears  Nose:   Nares normal, septum midline, mucosa normal, no drainage    or sinus tenderness  Throat:   Lips, mucosa, and tongue normal; teeth and gums normal  Neck:   Supple, symmetrical, trachea midline, no adenopathy;    thyroid:  no enlargement/tenderness/nodules; no carotid   bruit or JVD  Back:     Symmetric, no curvature, ROM normal, no CVA tenderness  Lungs:     Clear to auscultation bilaterally, respirations unlabored  Chest Wall:    No tenderness or deformity   Heart:    Regular rate and rhythm, S1 and S2 normal, no murmur, rub   or gallop  Breast Exam:    No tenderness, masses, or nipple abnormality  Abdomen:     Soft, non-tender, bowel sounds active all four quadrants,    no masses,  no organomegaly  Genitalia:    Normal female without lesion, discharge or tenderness  Extremities:   Extremities normal, atraumatic, no cyanosis or edema  Pulses:   2+ and symmetric all extremities  Skin:   Skin color, texture, turgor normal, no rashes or lesions  Lymph nodes:   Cervical, supraclavicular, and axillary nodes normal  Neurologic:   CNII-XII intact, normal strength, sensation and reflexes    throughout    Cervix: OS closed intact, no CMT, , non-friable, uterus midline and consistent in size with 11 Wg,     Lab Review Urine pregnancy test Labs reviewed {YES Radiologic studies reviewed {YES Assessment:    Pregnancy at [redacted]w[redacted]d weeks    Encounter for supervision of AMA pregnancy N/V Headaches HSV   Encounter for supervision of normal pregnancy,  antepartum, unspecified gravidity - Plan: Cytology - PAP, Cervicovaginal ancillary only, Hemoglobinopathy evaluation, Varicella zoster antibody, IgG, VITAMIN D 25 Hydroxy (Vit-D Deficiency, Fractures), Culture, OB Urine, Cystic Fibrosis Mutation 97, MaterniT21 PLUS Core+SCA, Hemoglobin A1c, Obstetric Panel, Including HIV, [place] MFM OB COMP + 14 WK, butalbital-acetaminophen-caffeine (FIORICET, ESGIC) 50-325-40 MG tablet, Prenat-FeAsp-Meth-FA-DHA w/o A (PRENATE PIXIE) 10-0.6-0.4-200 MG CAPS  Antepartum multigravida of advanced maternal age - Plan: Cytology - PAP, Cervicovaginal ancillary only, Hemoglobinopathy evaluation, Varicella zoster antibody, IgG, VITAMIN D 25 Hydroxy (Vit-D Deficiency, Fractures), Culture, OB Urine, Cystic Fibrosis Mutation 97, MaterniT21 PLUS Core+SCA, Hemoglobin A1c, Obstetric Panel, Including HIV  Nausea and vomiting during pregnancy prior to [redacted] weeks gestation - Plan: Doxylamine-Pyridoxine (DICLEGIS) 10-10 MG TBEC  Chronic tension-type headache, not intractable  HSV-2 infection   Plan:   May be a "script baby"    Encounter for supervision of normal pregnancy, antepartum, unspecified gravidity - Plan: Cytology - PAP, Cervicovaginal ancillary only, Hemoglobinopathy evaluation, Varicella zoster antibody, IgG, VITAMIN D 25 Hydroxy (Vit-D Deficiency, Fractures), Culture, OB Urine, Cystic Fibrosis Mutation 97, MaterniT21 PLUS Core+SCA, Hemoglobin A1c, Obstetric Panel, Including HIV, [place] MFM OB COMP + 14 WK, butalbital-acetaminophen-caffeine (FIORICET, ESGIC) 50-325-40 MG tablet, Prenat-FeAsp-Meth-FA-DHA w/o A (PRENATE PIXIE) 10-0.6-0.4-200 MG CAPS  Antepartum multigravida of advanced maternal age - Plan: Cytology - PAP, Cervicovaginal ancillary only, Hemoglobinopathy evaluation, Varicella zoster antibody, IgG, VITAMIN D 25 Hydroxy (Vit-D Deficiency, Fractures), Culture, OB Urine, Cystic Fibrosis Mutation 97, MaterniT21 PLUS Core+SCA, Hemoglobin A1c, Obstetric Panel, Including  HIV  Nausea and vomiting during pregnancy prior to [redacted] weeks gestation - Plan: Doxylamine-Pyridoxine (DICLEGIS) 10-10 MG TBEC  Chronic tension-type headache, not intractable- plan Fioricet given  HSV, will give valtrex at 36 weeks. PRN  Prenatal vitamins.  Counseling provided regarding continued use of seat belts, cessation of alcohol consumption, smoking or use of illicit drugs; infection precautions i.e., influenza/TDAP immunizations, toxoplasmosis,CMV, parvovirus, listeria and varicella; workplace safety, exercise during pregnancy; routine dental care, safe medications, sexual activity, hot tubs, saunas, pools, travel, caffeine use, fish and methlymercury, potential toxins, hair treatments, varicose veins Weight gain recommendations per IOM guidelines reviewed: underweight/BMI< 18.5--> gain 28 - 40 lbs; normal weight/BMI 18.5 - 24.9--> gain 25 - 35 lbs; overweight/BMI 25 - 29.9--> gain 15 - 25 lbs; obese/BMI >30->gain  11 - 20 lbs Problem list reviewed and updated. FIRST/CF mutation testing/NIPT/QUAD SCREEN/fragile X/Ashkenazi Jewish population testing/Spinal muscular atrophy discussed: requested. Role of ultrasound in pregnancy discussed; fetal survey: requested. Amniocentesis discussed: not discussed .  Meds ordered this encounter  Medications  . Doxylamine-Pyridoxine (DICLEGIS) 10-10 MG TBEC    Sig: Take 1 tablet with breakfast and lunch.  Take 2 tablets at bedtime.    Dispense:  100 tablet  Refill:  4  . butalbital-acetaminophen-caffeine (FIORICET, ESGIC) 50-325-40 MG tablet    Sig: Take 1-2 tablets by mouth every 6 (six) hours as needed.    Dispense:  45 tablet    Refill:  4  . Prenat-FeAsp-Meth-FA-DHA w/o A (PRENATE PIXIE) 10-0.6-0.4-200 MG CAPS    Sig: Take 1 tablet by mouth daily.    Dispense:  30 capsule    Refill:  12    Please process coupon: Rx BIN: [id number], RxPCN: [id number], RxGRP: [id number], RxID: [id number]  SUF: 01   Orders Placed This Encounter  Procedures  .  Culture, OB Urine  . [place] MFM OB COMP + 14 WK    Standing Status:   Future    Standing Expiration Date:   04/02/2018    Order Specific Question:   Reason for Exam (SYMPTOM  OR DIAGNOSIS REQUIRED)    Answer:   anatomy scan    Order Specific Question:   Preferred imaging location?    Answer:   [facility]  . Hemoglobinopathy evaluation  . Varicella zoster antibody, IgG  . VITAMIN D 25 Hydroxy (Vit-D Deficiency, Fractures)  . Cystic Fibrosis Mutation 97  . MaterniT21 PLUS Core+SCA    Order Specific Question:   Is the patient insulin dependent?    Answer:   No    Order Specific Question:   Please enter gestational age. This should be expressed as weeks AND days, i.e. 16w 6d. Enter weeks here. Enter days in next question.    Answer:   77    Order Specific Question:   Please enter gestational age. This should be expressed as weeks AND days, i.e. 16w 6d. Enter days here. Enter weeks in previous question.    Answer:   5    Order Specific Question:   How was gestational age calculated?    Answer:   LMP    Order Specific Question:   Please give the date of LMP OR Ultrasound OR Estimated date of delivery.    Answer:   08/19/2017    Order Specific Question:   Number of Fetuses (Type of Pregnancy):    Answer:   1    Order Specific Question:   Indications for performing the test? (please choose all that apply):    Answer:   Routine screening    Order Specific Question:   Other Indications? (Y=Yes, N=No)    Answer:   N    Order Specific Question:   If this is a repeat specimen, please indicate the reason:    Answer:   Not indicated    Order Specific Question:   Please specify the patient's race: (C=White/Caucasion, B=Black, I=Native American, A=Asian, H=Hispanic, O=Other, U=Unknown)    Answer:   B    Order Specific Question:   Donor Egg - indicate if the egg was obtained from in vitro fertilization.    Answer:   N    Order Specific Question:   Age of Egg Donor.    Answer:   65    Order Specific  Question:   Prior Down Syndrome/ONTD screening during current pregnancy.    Answer:   N    Order Specific Question:   Prior First Trimester Testing    Answer:   N    Order Specific Question:   Prior Second Trimester Testing    Answer:   N    Order Specific Question:   Family History of Neural Tube Defects    Answer:   N    Order Specific  Question:   Prior Pregnancy with Down Syndrome    Answer:   N    Order Specific Question:   Please give the patient's weight (in pounds)    Answer:   202  . Hemoglobin A1c  . Obstetric Panel, Including HIV    Follow up in 4 weeks. 50% of 30 min visit spent on counseling and coordination of care.

## 2017-02-03 LAB — CERVICOVAGINAL ANCILLARY ONLY
Bacterial vaginitis: POSITIVE — AB
Candida vaginitis: NEGATIVE
Chlamydia: NEGATIVE
NEISSERIA GONORRHEA: NEGATIVE
TRICH (WINDOWPATH): NEGATIVE

## 2017-02-04 ENCOUNTER — Other Ambulatory Visit: Payer: Self-pay | Admitting: Certified Nurse Midwife

## 2017-02-04 DIAGNOSIS — N760 Acute vaginitis: Principal | ICD-10-CM

## 2017-02-04 DIAGNOSIS — B9689 Other specified bacterial agents as the cause of diseases classified elsewhere: Secondary | ICD-10-CM

## 2017-02-04 MED ORDER — METRONIDAZOLE 0.75 % VA GEL: 1.0000 | Freq: Two times a day (BID) | VAGINAL | 0 refills | Status: DC

## 2017-02-05 LAB — CYTOLOGY - PAP
DIAGNOSIS: NEGATIVE
HPV (WINDOPATH): NOT DETECTED

## 2017-02-09 LAB — CULTURE, OB URINE

## 2017-02-09 LAB — HEMOGLOBINOPATHY EVALUATION
HGB C: 0 %
HGB S: 38 % — ABNORMAL HIGH
HGB VARIANT: 0 %
Hemoglobin A2 Quantitation: 4.3 % — ABNORMAL HIGH (ref 1.8–3.2)
Hemoglobin F Quantitation: 0 % (ref 0.0–2.0)
Hgb A: 57.7 % — ABNORMAL LOW (ref 96.4–98.8)

## 2017-02-09 LAB — HEMOGLOBIN A1C
Est. average glucose Bld gHb Est-mCnc: 114 mg/dL
HEMOGLOBIN A1C: 5.6 % (ref 4.8–5.6)

## 2017-02-09 LAB — OBSTETRIC PANEL, INCLUDING HIV
ANTIBODY SCREEN: NEGATIVE
Basophils Absolute: 0 10*3/uL (ref 0.0–0.2)
Basos: 0 %
EOS (ABSOLUTE): 0.1 10*3/uL (ref 0.0–0.4)
Eos: 1 %
HEMATOCRIT: 33.8 % — AB (ref 34.0–46.6)
HIV SCREEN 4TH GENERATION: NONREACTIVE
Hemoglobin: 11.8 g/dL (ref 11.1–15.9)
Hepatitis B Surface Ag: NEGATIVE
Immature Grans (Abs): 0 10*3/uL (ref 0.0–0.1)
Immature Granulocytes: 0 %
LYMPHS: 21 %
Lymphocytes Absolute: 1.8 10*3/uL (ref 0.7–3.1)
MCH: 28.1 pg (ref 26.6–33.0)
MCHC: 34.9 g/dL (ref 31.5–35.7)
MCV: 81 fL (ref 79–97)
MONOS ABS: 0.5 10*3/uL (ref 0.1–0.9)
Monocytes: 6 %
NEUTROS ABS: 6.2 10*3/uL (ref 1.4–7.0)
Neutrophils: 72 %
PLATELETS: 353 10*3/uL (ref 150–379)
RBC: 4.2 x10E6/uL (ref 3.77–5.28)
RDW: 13.8 % (ref 12.3–15.4)
RPR Ser Ql: NONREACTIVE
Rh Factor: POSITIVE
Rubella Antibodies, IGG: 4.85 index (ref >0.99)
WBC: 8.6 10*3/uL (ref 3.4–10.8)

## 2017-02-09 LAB — VARICELLA ZOSTER ANTIBODY, IGG: Varicella zoster IgG: >4000 index (ref >165)

## 2017-02-09 LAB — CYSTIC FIBROSIS MUTATION 97: GENE DIS ANAL CARRIER INTERP BLD/T-IMP: NOT DETECTED

## 2017-02-09 LAB — VITAMIN D 25 HYDROXY (VIT D DEFICIENCY, FRACTURES): VIT D 25 HYDROXY: 11 ng/mL — AB (ref 30.0–100.0)

## 2017-02-09 LAB — URINE CULTURE, OB REFLEX

## 2017-02-10 ENCOUNTER — Other Ambulatory Visit: Payer: Self-pay | Admitting: Certified Nurse Midwife

## 2017-02-10 DIAGNOSIS — R7989 Other specified abnormal findings of blood chemistry: Secondary | ICD-10-CM

## 2017-02-10 DIAGNOSIS — Z3480 Encounter for supervision of other normal pregnancy, unspecified trimester: Secondary | ICD-10-CM

## 2017-02-10 LAB — MATERNIT21 PLUS CORE+SCA
Chromosome 13: NEGATIVE
Chromosome 18: NEGATIVE
Chromosome 21: NEGATIVE
Y CHROMOSOME: DETECTED

## 2017-02-10 MED ORDER — VITAMIN D (ERGOCALCIFEROL) 1.25 MG (50000 UNIT) PO CAPS: 50000.0000 [IU] | ORAL_CAPSULE | ORAL | 2 refills | Status: DC

## 2017-03-02 ENCOUNTER — Ambulatory Visit (INDEPENDENT_AMBULATORY_CARE_PROVIDER_SITE_OTHER): Payer: Medicaid Other | Admitting: Certified Nurse Midwife

## 2017-03-02 VITALS — BP 127/85 | HR 96 | Temp 98.0°F | Wt 207.8 lb

## 2017-03-02 DIAGNOSIS — R7989 Other specified abnormal findings of blood chemistry: Secondary | ICD-10-CM

## 2017-03-02 DIAGNOSIS — B373 Candidiasis of vulva and vagina: Secondary | ICD-10-CM

## 2017-03-02 DIAGNOSIS — Z3480 Encounter for supervision of other normal pregnancy, unspecified trimester: Secondary | ICD-10-CM

## 2017-03-02 DIAGNOSIS — O09522 Supervision of elderly multigravida, second trimester: Secondary | ICD-10-CM

## 2017-03-02 DIAGNOSIS — O98812 Other maternal infectious and parasitic diseases complicating pregnancy, second trimester: Secondary | ICD-10-CM

## 2017-03-02 DIAGNOSIS — B009 Herpesviral infection, unspecified: Secondary | ICD-10-CM

## 2017-03-02 DIAGNOSIS — B3731 Acute candidiasis of vulva and vagina: Secondary | ICD-10-CM

## 2017-03-02 MED ORDER — FLUCONAZOLE 150 MG PO TABS: 150.0000 mg | ORAL_TABLET | Freq: Once | ORAL | 0 refills | Status: AC

## 2017-03-02 MED ORDER — TERCONAZOLE 0.8 % VA CREA: 1.0000 | TOPICAL_CREAM | Freq: Every day | VAGINAL | 0 refills | Status: DC

## 2017-03-02 NOTE — Progress Notes (Signed)
Patient states that's she has started to feel fetal movement, denies pain/contractions.

## 2017-03-02 NOTE — Progress Notes (Signed)
   PRENATAL VISIT NOTE  Subjective:  Julia Chang is a 36 y.o. [id number] at 743w5d being seen today for ongoing prenatal care.  She is currently monitored for the following issues for this low-risk pregnancy and has Supervision of normal pregnancy, antepartum; AMA (advanced maternal age) multigravida 35+; Chronic tension-type headache, not intractable; HSV-2 infection; and Low vitamin D level on her problem list.  Patient reports headache, no bleeding, no contractions, no cramping, no leaking and has not tried anything for her HA.  Contractions: Not present. Vag. Bleeding: None.  Movement: Present. Denies leaking of fluid.   The following portions of the patient's history were reviewed and updated as appropriate: allergies, current medications, past family history, past medical history, past social history, past surgical history and problem list. Problem list updated.  Objective:   Vitals:   03/02/17 1520  BP: 127/85  Pulse: 96  Temp: 98 F (36.7 C)  Weight: 207 lb 12.8 oz (94.3 kg)    Fetal Status: Fetal Heart Rate (bpm): 149   Movement: Present     General:  Alert, oriented and cooperative. Patient is in no acute distress.  Skin: Skin is warm and dry. No rash noted.   Cardiovascular: Normal heart rate noted  Respiratory: Normal respiratory effort, no problems with respiration noted  Abdomen: Soft, gravid, appropriate for gestational age. Pain/Pressure: Absent     Pelvic:  Cervical exam deferred        Extremities: Normal range of motion.  Edema: None  Mental Status: Normal mood and affect. Normal behavior. Normal judgment and thought content.   Assessment and Plan:  Pregnancy: G5P4004 at 5343w5d  1. Supervision of other normal pregnancy, antepartum     Doing well.  Slight HA, normotensive.  OTC Tylenol.  - AFP, Serum, Open Spina Bifida  2. Low vitamin D level     Taking weekly vitamin D  3. HSV-2 infection     Valtrex @36  weeks  4. Elderly multigravida in second  trimester      AFP today, fetal anatomy US scheduled  5. Yeast vaginitis     - fluconazole (DIFLUCAN) 150 MG tablet; Take 1 tablet (150 mg total) by mouth once.  Dispense: 1 tablet; Refill: 0 - terconazole (TERAZOL 3) 0.8 % vaginal cream; Place 1 applicator vaginally at bedtime.  Dispense: 20 g; Refill: 0  Preterm labor symptoms and general obstetric precautions including but not limited to vaginal bleeding, contractions, leaking of fluid and fetal movement were reviewed in detail with the patient. Please refer to After Visit Summary for other counseling recommendations.  Return in about 4 weeks (around 03/30/2017) for ROB.   Roe Coombsachelle A Ronell Boldin, CNM

## 2017-03-04 ENCOUNTER — Other Ambulatory Visit: Payer: Self-pay | Admitting: Certified Nurse Midwife

## 2017-03-04 DIAGNOSIS — Z3480 Encounter for supervision of other normal pregnancy, unspecified trimester: Secondary | ICD-10-CM

## 2017-03-04 LAB — AFP, SERUM, OPEN SPINA BIFIDA
AFP MoM: 0.53
AFP Value: 14.7 ng/mL
GEST. AGE ON COLLECTION DATE: 15.7 wk
Maternal Age At EDD: 36 years
OSBR RISK 1 IN: 10000
TEST RESULTS AFP: NEGATIVE
Weight: 208 [lb_av]

## 2017-03-22 ENCOUNTER — Ambulatory Visit (HOSPITAL_COMMUNITY)
Admission: RE | Admit: 2017-03-22 | Discharge: 2017-03-22 | Disposition: A | Discharge status: Home / Self Care | Payer: Medicaid Other | Source: Ambulatory Visit | Attending: Certified Nurse Midwife | Admitting: Certified Nurse Midwife

## 2017-03-22 DIAGNOSIS — O09522 Supervision of elderly multigravida, second trimester: Secondary | ICD-10-CM | POA: Insufficient documentation

## 2017-03-22 DIAGNOSIS — O99212 Obesity complicating pregnancy, second trimester: Secondary | ICD-10-CM | POA: Diagnosis not present

## 2017-03-22 DIAGNOSIS — Z363 Encounter for antenatal screening for malformations: Secondary | ICD-10-CM | POA: Insufficient documentation

## 2017-03-22 DIAGNOSIS — Z3A18 18 weeks gestation of pregnancy: Secondary | ICD-10-CM | POA: Diagnosis not present

## 2017-03-22 DIAGNOSIS — Z3490 Encounter for supervision of normal pregnancy, unspecified, unspecified trimester: Secondary | ICD-10-CM

## 2017-03-24 ENCOUNTER — Other Ambulatory Visit: Payer: Self-pay | Admitting: Certified Nurse Midwife

## 2017-03-24 DIAGNOSIS — O09522 Supervision of elderly multigravida, second trimester: Secondary | ICD-10-CM

## 2017-03-24 DIAGNOSIS — Z3480 Encounter for supervision of other normal pregnancy, unspecified trimester: Secondary | ICD-10-CM

## 2017-03-30 ENCOUNTER — Ambulatory Visit (INDEPENDENT_AMBULATORY_CARE_PROVIDER_SITE_OTHER): Payer: Medicaid Other | Admitting: Certified Nurse Midwife

## 2017-03-30 ENCOUNTER — Encounter: Payer: Self-pay | Admitting: Certified Nurse Midwife

## 2017-03-30 VITALS — BP 121/75 | HR 91 | Wt 212.0 lb

## 2017-03-30 DIAGNOSIS — O98312 Other infections with a predominantly sexual mode of transmission complicating pregnancy, second trimester: Secondary | ICD-10-CM

## 2017-03-30 DIAGNOSIS — Z3480 Encounter for supervision of other normal pregnancy, unspecified trimester: Secondary | ICD-10-CM

## 2017-03-30 DIAGNOSIS — B009 Herpesviral infection, unspecified: Secondary | ICD-10-CM

## 2017-03-30 DIAGNOSIS — E559 Vitamin D deficiency, unspecified: Secondary | ICD-10-CM

## 2017-03-30 DIAGNOSIS — R7989 Other specified abnormal findings of blood chemistry: Secondary | ICD-10-CM

## 2017-03-30 DIAGNOSIS — O09522 Supervision of elderly multigravida, second trimester: Secondary | ICD-10-CM

## 2017-03-30 DIAGNOSIS — A6009 Herpesviral infection of other urogenital tract: Secondary | ICD-10-CM

## 2017-03-30 NOTE — Progress Notes (Signed)
   PRENATAL VISIT NOTE  Subjective:  Julia Chang is a 36 y.o. [id number] at [redacted]w[redacted]d being seen today for ongoing prenatal care.  She is currently monitored for the following issues for this high-risk pregnancy and has Supervision of normal pregnancy, antepartum; AMA (advanced maternal age) multigravida 35+; Chronic tension-type headache, not intractable; HSV-2 infection; and Low vitamin D level on her problem list.  Patient reports no bleeding, no contractions, no cramping, no leaking and mild carpel tunnel symptoms.  Contractions: Not present. Vag. Bleeding: None.  Movement: Present. Denies leaking of fluid.   The following portions of the patient's history were reviewed and updated as appropriate: allergies, current medications, past family history, past medical history, past social history, past surgical history and problem list. Problem list updated.  Objective:   Vitals:   03/30/17 1616  BP: 121/75  Pulse: 91  Weight: 212 lb (96.2 kg)    Fetal Status: Fetal Heart Rate (bpm): 146 Fundal Height: 20 cm Movement: Present     General:  Alert, oriented and cooperative. Patient is in no acute distress.  Skin: Skin is warm and dry. No rash noted.   Cardiovascular: Normal heart rate noted  Respiratory: Normal respiratory effort, no problems with respiration noted  Abdomen: Soft, gravid, appropriate for gestational age. Pain/Pressure: Absent     Pelvic:  Cervical exam deferred        Extremities: Normal range of motion.  Edema: None  Mental Status: Normal mood and affect. Normal behavior. Normal judgment and thought content.   Assessment and Plan:  Pregnancy: G5P4004 at [redacted]w[redacted]d  1. HSV-2 infection     Valtrex  weeks  2. Low vitamin D level     Taking weekly vitamin D  3. Elderly multigravida in second trimester       4. Supervision of other normal pregnancy, antepartum      Carpel tunnel symptoms: if increased consider wrist splints.   Preterm labor symptoms and general  obstetric precautions including but not limited to vaginal bleeding, contractions, leaking of fluid and fetal movement were reviewed in detail with the patient. Please refer to After Visit Summary for other counseling recommendations.  Return in about 4 weeks (around 04/27/2017) for ROB.   Roe Coombs, CNM

## 2017-04-27 ENCOUNTER — Ambulatory Visit (INDEPENDENT_AMBULATORY_CARE_PROVIDER_SITE_OTHER): Payer: Medicaid Other | Admitting: Certified Nurse Midwife

## 2017-04-27 ENCOUNTER — Encounter: Payer: Self-pay | Admitting: Certified Nurse Midwife

## 2017-04-27 VITALS — BP 121/72 | HR 89 | Wt 212.0 lb

## 2017-04-27 DIAGNOSIS — O09522 Supervision of elderly multigravida, second trimester: Secondary | ICD-10-CM

## 2017-04-27 DIAGNOSIS — E559 Vitamin D deficiency, unspecified: Secondary | ICD-10-CM

## 2017-04-27 DIAGNOSIS — B009 Herpesviral infection, unspecified: Secondary | ICD-10-CM

## 2017-04-27 DIAGNOSIS — M545 Low back pain, unspecified: Secondary | ICD-10-CM

## 2017-04-27 DIAGNOSIS — Z3482 Encounter for supervision of other normal pregnancy, second trimester: Secondary | ICD-10-CM

## 2017-04-27 DIAGNOSIS — Z3480 Encounter for supervision of other normal pregnancy, unspecified trimester: Secondary | ICD-10-CM

## 2017-04-27 DIAGNOSIS — R7989 Other specified abnormal findings of blood chemistry: Secondary | ICD-10-CM

## 2017-04-27 DIAGNOSIS — O26899 Other specified pregnancy related conditions, unspecified trimester: Secondary | ICD-10-CM

## 2017-04-27 DIAGNOSIS — R102 Pelvic and perineal pain: Secondary | ICD-10-CM

## 2017-04-27 MED ORDER — COMFORT FIT MATERNITY SUPP LG MISC: 1.0000 [IU] | Freq: Every day | 0 refills | Status: DC

## 2017-04-27 NOTE — Progress Notes (Signed)
Pt states she is having right sided hip/leg pain.

## 2017-04-27 NOTE — Progress Notes (Signed)
   PRENATAL VISIT NOTE  Subjective:  Julia Chang is a 36 y.o. [id number] at 6924w5d being seen today for ongoing prenatal care.  She is currently monitored for the following issues for this low-risk pregnancy and has Supervision of normal pregnancy, antepartum; AMA (advanced maternal age) multigravida 35+; Chronic tension-type headache, not intractable; HSV-2 infection; and Low vitamin D level on her problem list.  Patient reports backache, no bleeding, no contractions, no cramping, no leaking and round ligament type pain.  Contractions: Not present.  .  Movement: Present. Denies leaking of fluid.   The following portions of the patient's history were reviewed and updated as appropriate: allergies, current medications, past family history, past medical history, past social history, past surgical history and problem list. Problem list updated.  Objective:   Vitals:   04/27/17 1605  BP: 121/72  Pulse: 89  Weight: 212 lb (96.2 kg)    Fetal Status: Fetal Heart Rate (bpm): 144 Fundal Height: 24 cm Movement: Present     General:  Alert, oriented and cooperative. Patient is in no acute distress.  Skin: Skin is warm and dry. No rash noted.   Cardiovascular: Normal heart rate noted  Respiratory: Normal respiratory effort, no problems with respiration noted  Abdomen: Soft, gravid, appropriate for gestational age. Pain/Pressure: Present     Pelvic:  Cervical exam deferred        Extremities: Normal range of motion.     Mental Status: Normal mood and affect. Normal behavior. Normal judgment and thought content.   Assessment and Plan:  Pregnancy: G5P4004 at 7424w5d  1. Supervision of other normal pregnancy, antepartum        2. Elderly multigravida in second trimester     36 years of age  603. HSV-2 infection    Valtrex @36  wks  4. Low vitamin D level     Taking vitamin D weekly  5. Pain of round ligament during pregnancy     Homeopathic comforts discussed.  - Elastic Bandages &  Supports (COMFORT FIT MATERNITY SUPP LG) MISC; 1 Units by Does not apply route daily.  Dispense: 1 each; Refill: 0  6. Lumbar back pain      - Elastic Bandages & Supports (COMFORT FIT MATERNITY SUPP LG) MISC; 1 Units by Does not apply route daily.  Dispense: 1 each; Refill: 0  Preterm labor symptoms and general obstetric precautions including but not limited to vaginal bleeding, contractions, leaking of fluid and fetal movement were reviewed in detail with the patient. Please refer to After Visit Summary for other counseling recommendations.  Return in about 4 weeks (around 05/25/2017) for ROB, 2 hr OGTT.   Roe Coombsachelle A Chief Walkup, CNM

## 2017-05-07 ENCOUNTER — Ambulatory Visit (HOSPITAL_COMMUNITY)
Admission: RE | Admit: 2017-05-07 | Discharge: 2017-05-07 | Disposition: A | Discharge status: Home / Self Care | Payer: Medicaid Other | Source: Ambulatory Visit | Attending: Certified Nurse Midwife | Admitting: Certified Nurse Midwife

## 2017-05-07 DIAGNOSIS — Z6832 Body mass index (BMI) 32.0-32.9, adult: Secondary | ICD-10-CM | POA: Diagnosis not present

## 2017-05-07 DIAGNOSIS — Z362 Encounter for other antenatal screening follow-up: Secondary | ICD-10-CM | POA: Insufficient documentation

## 2017-05-07 DIAGNOSIS — O99212 Obesity complicating pregnancy, second trimester: Secondary | ICD-10-CM | POA: Diagnosis not present

## 2017-05-07 DIAGNOSIS — Z3A25 25 weeks gestation of pregnancy: Secondary | ICD-10-CM | POA: Insufficient documentation

## 2017-05-07 DIAGNOSIS — Z3480 Encounter for supervision of other normal pregnancy, unspecified trimester: Secondary | ICD-10-CM

## 2017-05-07 DIAGNOSIS — O09522 Supervision of elderly multigravida, second trimester: Secondary | ICD-10-CM | POA: Diagnosis present

## 2017-05-10 ENCOUNTER — Other Ambulatory Visit: Payer: Self-pay | Admitting: Certified Nurse Midwife

## 2017-05-10 DIAGNOSIS — Z3480 Encounter for supervision of other normal pregnancy, unspecified trimester: Secondary | ICD-10-CM

## 2017-05-11 ENCOUNTER — Encounter: Payer: Self-pay | Admitting: *Deleted

## 2017-05-19 ENCOUNTER — Inpatient Hospital Stay (HOSPITAL_COMMUNITY)
Admission: AD | Admit: 2017-05-19 | Discharge: 2017-05-20 | Disposition: A | Discharge status: Home / Self Care | Payer: Medicaid Other | Source: Ambulatory Visit | Attending: Obstetrics & Gynecology | Admitting: Obstetrics & Gynecology

## 2017-05-19 DIAGNOSIS — Z3A27 27 weeks gestation of pregnancy: Secondary | ICD-10-CM | POA: Diagnosis not present

## 2017-05-19 DIAGNOSIS — J029 Acute pharyngitis, unspecified: Secondary | ICD-10-CM

## 2017-05-19 DIAGNOSIS — Z3480 Encounter for supervision of other normal pregnancy, unspecified trimester: Secondary | ICD-10-CM

## 2017-05-19 DIAGNOSIS — R69 Illness, unspecified: Secondary | ICD-10-CM | POA: Diagnosis not present

## 2017-05-19 DIAGNOSIS — O26892 Other specified pregnancy related conditions, second trimester: Secondary | ICD-10-CM | POA: Diagnosis not present

## 2017-05-19 DIAGNOSIS — O9989 Other specified diseases and conditions complicating pregnancy, childbirth and the puerperium: Secondary | ICD-10-CM | POA: Diagnosis not present

## 2017-05-19 DIAGNOSIS — J111 Influenza due to unidentified influenza virus with other respiratory manifestations: Secondary | ICD-10-CM

## 2017-05-20 DIAGNOSIS — R69 Illness, unspecified: Secondary | ICD-10-CM

## 2017-05-20 DIAGNOSIS — O9989 Other specified diseases and conditions complicating pregnancy, childbirth and the puerperium: Secondary | ICD-10-CM

## 2017-05-20 LAB — CBC WITH DIFFERENTIAL/PLATELET
BASOS ABS: 0 10*3/uL (ref 0.0–0.1)
BASOS PCT: 0 %
Eosinophils Absolute: 0 10*3/uL (ref 0.0–0.7)
Eosinophils Relative: 0 %
HEMATOCRIT: 29.4 % — AB (ref 36.0–46.0)
Hemoglobin: 10.2 g/dL — ABNORMAL LOW (ref 12.0–15.0)
LYMPHS PCT: 10 %
Lymphs Abs: 0.8 10*3/uL (ref 0.7–4.0)
MCH: 29.7 pg (ref 26.0–34.0)
MCHC: 34.7 g/dL (ref 30.0–36.0)
MCV: 85.5 fL (ref 78.0–100.0)
Monocytes Absolute: 0.5 10*3/uL (ref 0.1–1.0)
Monocytes Relative: 6 %
NEUTROS ABS: 7 10*3/uL (ref 1.7–7.7)
NEUTROS PCT: 84 %
PLATELETS: 235 10*3/uL (ref 150–400)
RBC: 3.44 MIL/uL — AB (ref 3.87–5.11)
RDW: 13.3 % (ref 11.5–15.5)
WBC: 8.4 10*3/uL (ref 4.0–10.5)

## 2017-05-20 LAB — INFLUENZA PANEL BY PCR (TYPE A & B)
INFLBPCR: NEGATIVE
Influenza A By PCR: NEGATIVE

## 2017-05-20 LAB — URINALYSIS, ROUTINE W REFLEX MICROSCOPIC
Bilirubin Urine: NEGATIVE
GLUCOSE, UA: NEGATIVE mg/dL
Hgb urine dipstick: NEGATIVE
Ketones, ur: 40 mg/dL — AB
LEUKOCYTES UA: NEGATIVE
NITRITE: NEGATIVE
PH: 6 (ref 5.0–8.0)
Protein, ur: NEGATIVE mg/dL

## 2017-05-20 LAB — BASIC METABOLIC PANEL
ANION GAP: 8 (ref 5–15)
BUN: 5 mg/dL — ABNORMAL LOW (ref 6–20)
CALCIUM: 8.7 mg/dL — AB (ref 8.9–10.3)
CO2: 21 mmol/L — AB (ref 22–32)
Chloride: 105 mmol/L (ref 101–111)
Creatinine, Ser: 0.63 mg/dL (ref 0.44–1.00)
GFR calc non Af Amer: >60 mL/min (ref >60)
Glucose, Bld: 101 mg/dL — ABNORMAL HIGH (ref 65–99)
POTASSIUM: 3.5 mmol/L (ref 3.5–5.1)
Sodium: 134 mmol/L — ABNORMAL LOW (ref 135–145)

## 2017-05-20 LAB — RAPID STREP SCREEN (MED CTR MEBANE ONLY): STREPTOCOCCUS, GROUP A SCREEN (DIRECT): NEGATIVE

## 2017-05-20 MED ORDER — IBUPROFEN 600 MG PO TABS
600.0000 mg | ORAL_TABLET | Freq: Once | ORAL | Status: AC
  Administered 2017-05-20: 600 mg via ORAL
  Filled 2017-05-20: qty 1

## 2017-05-20 MED ORDER — LACTATED RINGERS IV BOLUS (SEPSIS)
1000.0000 mL | Freq: Once | INTRAVENOUS | Status: AC
  Administered 2017-05-20: 1000 mL via INTRAVENOUS

## 2017-05-20 NOTE — MAU Provider Note (Signed)
History     CSN: [id number]  Arrival date and time: 05/19/17 2336  First Provider Initiated Contact with Patient 05/20/17 0041      Chief Complaint  Patient presents with  . Fever  . Sore Throat  . Generalized Body Aches   HPI Julia Chang is a 36 y.o. [id number] at 6414w0d who presents with fever, body aches & sore throat. Symptoms began yesterday morning. 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 Had fever at home yesterday of 101. Endorses sore throat & body aches. Rates pain 8/10. Has been taking tylenol without relief. Denies ear pain, congestion, cough, abdominal pain, dysuria, LOF, sinus pain. Positive fetal movement.  OB History    Gravida Para Term Preterm AB Living   5 4 4     4    SAB TAB Ectopic Multiple Live Births           4      Past Medical History:  Diagnosis Date  . 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    Past Surgical History:  Procedure Laterality Date  . NO PAST SURGERIES      Family History  Problem Relation Age of Onset  . Diabetes Mother   . Anesthesia problems Neg Hx   . Hypotension Neg Hx   . Malignant hyperthermia Neg Hx   . Pseudochol deficiency Neg Hx   . Other Neg Hx     Social History  Substance Use Topics  . Smoking status: Never Smoker  . Smokeless tobacco: Never Used  . Alcohol use No    Allergies: No Known Allergies  Prescriptions Prior to Admission  Medication Sig Dispense Refill Last Dose  . Elastic Bandages & Supports (COMFORT FIT MATERNITY SUPP LG) MISC 1 Units by Does not apply route daily. 1 each 0   . Prenat-FeAsp-Meth-FA-DHA w/o A (PRENATE PIXIE) 10-0.6-0.4-200 MG CAPS Take 1 tablet by mouth daily. 30 capsule 12 Taking  . terconazole (TERAZOL 3) 0.8 % vaginal cream Place 1 applicator vaginally at bedtime. 20 g 0   . Vitamin D, Ergocalciferol, (DRISDOL) 50000 units CAPS capsule Take 1 capsule (50,000 Units total) by mouth every 7 (seven) days. 30 capsule 2 Taking     Review of Systems  Constitutional: Positive for fever. Negative for chills.  HENT: Positive for sore throat. Negative for ear pain, rhinorrhea and sinus pain.   Respiratory: Negative for cough.   Gastrointestinal: Negative.   Genitourinary: Negative.   Musculoskeletal: Positive for myalgias.   Physical Exam   Blood pressure (!) 112/46, pulse (!) 119, temperature 98.9 F (37.2 C), temperature source Oral, resp. rate 17, height 5\' 7"  (1.702 m), weight 210 lb (95.3 kg), last menstrual period 11/12/2016, SpO2 99 %, unknown if currently breastfeeding.  Physical Exam  Nursing note and vitals reviewed. Constitutional: She is oriented to person, place, and time. She appears well-developed and well-nourished. No distress.  HENT:  Head: Normocephalic and atraumatic.  Eyes: Conjunctivae are normal. Right eye exhibits no discharge. Left eye exhibits no discharge. No scleral icterus.  Neck: Normal range of motion.  Cardiovascular: Regular rhythm and normal heart sounds.  Tachycardia present.   No murmur heard. Respiratory: Effort normal and breath sounds normal. No respiratory distress. She has no wheezes.  GI: Soft. There is no tenderness.  Neurological: She is alert and oriented to person, place, and time.  Skin: Skin is warm and dry. She is not diaphoretic.  Psychiatric: She  has a normal mood and affect. Her behavior is normal. Judgment and thought content normal.   Fetal Tracing:  Baseline: 145 Variability: moderate Accelerations: none Decelerations: none  Toco: none MAU Course  Procedures Results for orders placed or performed during the hospital encounter of 05/19/17 (from the past 24 hour(s))  Urinalysis, Routine w reflex microscopic     Status: Abnormal   Collection Time: 05/19/17 11:50 PM  Result Value Ref Range   Color, Urine YELLOW YELLOW   APPearance CLEAR CLEAR   Specific Gravity, Urine <1.005 (L) 1.005 - 1.030   pH 6.0 5.0 - 8.0   Glucose, UA NEGATIVE NEGATIVE  mg/dL   Hgb urine dipstick NEGATIVE NEGATIVE   Bilirubin Urine NEGATIVE NEGATIVE   Ketones, ur 40 (A) NEGATIVE mg/dL   Protein, ur NEGATIVE NEGATIVE mg/dL   Nitrite NEGATIVE NEGATIVE   Leukocytes, UA NEGATIVE NEGATIVE  Influenza panel by PCR (type A & B)     Status: None   Collection Time: 05/20/17 12:15 AM  Result Value Ref Range   Influenza A By PCR NEGATIVE NEGATIVE   Influenza B By PCR NEGATIVE NEGATIVE  CBC with Differential/Platelet     Status: Abnormal   Collection Time: 05/20/17  1:44 AM  Result Value Ref Range   WBC 8.4 4.0 - 10.5 K/uL   RBC 3.44 (L) 3.87 - 5.11 MIL/uL   Hemoglobin 10.2 (L) 12.0 - 15.0 g/dL   HCT 16.1 (L) 09.6 - 04.5 %   MCV 85.5 78.0 - 100.0 fL   MCH 29.7 26.0 - 34.0 pg   MCHC 34.7 30.0 - 36.0 g/dL   RDW 40.9 81.1 - 91.4 %   Platelets 235 150 - 400 K/uL   Neutrophils Relative % 84 %   Neutro Abs 7.0 1.7 - 7.7 K/uL   Lymphocytes Relative 10 %   Lymphs Abs 0.8 0.7 - 4.0 K/uL   Monocytes Relative 6 %   Monocytes Absolute 0.5 0.1 - 1.0 K/uL   Eosinophils Relative 0 %   Eosinophils Absolute 0.0 0.0 - 0.7 K/uL   Basophils Relative 0 %   Basophils Absolute 0.0 0.0 - 0.1 K/uL  Basic metabolic panel     Status: Abnormal   Collection Time: 05/20/17  1:44 AM  Result Value Ref Range   Sodium 134 (L) 135 - 145 mmol/L   Potassium 3.5 3.5 - 5.1 mmol/L   Chloride 105 101 - 111 mmol/L   CO2 21 (L) 22 - 32 mmol/L   Glucose, Bld 101 (H) 65 - 99 mg/dL   BUN 5 (L) 6 - 20 mg/dL   Creatinine, Ser 7.82 0.44 - 1.00 mg/dL   Calcium 8.7 (L) 8.9 - 10.3 mg/dL   GFR calc non Af Amer >60 >60 mL/min   GFR calc Af Amer >60 >60 mL/min   Anion gap 8 5 - 15    MDM Fetal tracing appropriate for gestation CBC, BMP Ibuprofen 600 mg PO Flu swab negative Strep swab pending Assessment and Plan  A: 1. Influenza-like illness   2. Supervision of other normal pregnancy, antepartum   3. Acute pharyngitis, unspecified etiology    P; Discharge home Discussed OTC meds &  symptomatic treatment Increase oral fluids Strep swab pending Discussed reasons to return to MAU  Judeth Horn 05/20/2017, 12:41 AM

## 2017-05-20 NOTE — MAU Note (Signed)
Pt reports sore throat, fever and body aches that started earlier today. States she had a temp of 101 at home. Took acetaminophen at 2100. 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 Pt denies contractions, vaginal bleeding or LOF. Reports good fetal movement.

## 2017-05-20 NOTE — Discharge Instructions (Signed)
Safe Medications in Pregnancy   Acne: Benzoyl Peroxide Salicylic Acid  Backache/Headache: Tylenol: 2 regular strength every 4 hours OR              2 Extra strength every 6 hours  Colds/Coughs/Allergies: Benadryl (alcohol free) 25 mg every 6 hours as needed Breath right strips Claritin Cepacol throat lozenges Chloraseptic throat spray Cold-Eeze- up to three times per day Cough drops, alcohol free Flonase (by prescription only) Guaifenesin Mucinex Robitussin DM (plain only, alcohol free) Saline nasal spray/drops Sudafed (pseudoephedrine) & Actifed ** use only after [redacted] weeks gestation and if you do not have high blood pressure Tylenol Vicks Vaporub Zinc lozenges Zyrtec   Constipation: Colace Ducolax suppositories Fleet enema Glycerin suppositories Metamucil Milk of magnesia Miralax Senokot Smooth move tea  Diarrhea: Kaopectate Imodium A-D  *NO pepto Bismol  Hemorrhoids: Anusol Anusol HC Preparation H Tucks  Indigestion: Tums Maalox Mylanta Zantac  Pepcid  Insomnia: Benadryl (alcohol free) 25mg  every 6 hours as needed Tylenol PM Unisom, no Gelcaps  Leg Cramps: Tums MagGel  Nausea/Vomiting:  Bonine Dramamine Emetrol Ginger extract Sea bands Meclizine  Nausea medication to take during pregnancy:  Unisom (doxylamine succinate 25 mg tablets) Take one tablet daily at bedtime. If symptoms are not adequately controlled, the dose can be increased to a maximum recommended dose of two tablets daily (1/2 tablet in the morning, 1/2 tablet mid-afternoon and one at bedtime). Vitamin B6 100mg  tablets. Take one tablet twice a day (up to 200 mg per day).  Skin Rashes: Aveeno products Benadryl cream or 25mg  every 6 hours as needed Calamine Lotion 1% cortisone cream  Yeast infection: Gyne-lotrimin 7 Monistat 7  Gum/tooth pain: Anbesol  **If taking multiple medications, please check labels to avoid duplicating the same active ingredients **take  medication as directed on the label ** Do not exceed 4000 mg of tylenol in 24 hours **Do not take medications that contain aspirin or ibuprofen        Sore Throat A sore throat is pain, burning, irritation, or scratchiness in the throat. When you have a sore throat, you may feel pain or tenderness in your throat when you swallow or talk. Many things can cause a sore throat, including:  An infection.  Seasonal allergies.  Dryness in the air.  Irritants, such as smoke or pollution.  Gastroesophageal reflux disease (GERD).  A tumor.  A sore throat is often the first sign of another sickness. It may happen with other symptoms, such as coughing, sneezing, fever, and swollen neck glands. Most sore throats go away without medical treatment. Follow these instructions at home:  Take over-the-counter medicines only as told by your health care provider.  Drink enough fluids to keep your urine clear or pale yellow.  Rest as needed.  To help with pain, try: ? Sipping warm liquids, such as broth, herbal tea, or warm water. ? Eating or drinking cold or frozen liquids, such as frozen ice pops. ? Gargling with a salt-water mixture 3-4 times a day or as needed. To make a salt-water mixture, completely dissolve -1 tsp of salt in 1 cup of warm water. ? Sucking on hard candy or throat lozenges. ? Putting a cool-mist humidifier in your bedroom at night to moisten the air. ? Sitting in the bathroom with the door closed for 5-10 minutes while you run hot water in the shower.  Do not use any tobacco products, such as cigarettes, chewing tobacco, and e-cigarettes. If you need help quitting, ask your health care  provider. Contact a health care provider if:  You have a fever for more than 2-3 days.  You have symptoms that last (are persistent) for more than 2-3 days.  Your throat does not get better within 7 days.  You have a fever and your symptoms suddenly get worse. Get help right away  if:  You have difficulty breathing.  You cannot swallow fluids, soft foods, or your saliva.  You have increased swelling in your throat or neck.  You have persistent nausea and vomiting. This information is not intended to replace advice given to you by your health care provider. Make sure you discuss any questions you have with your health care provider. Document Released: 12/31/2004 Document Revised: 07/19/2016 Document Reviewed: 09/13/2015 Elsevier Interactive Patient Education  [employer]2018 [employer]

## 2017-05-22 LAB — CULTURE, GROUP A STREP (THRC)

## 2017-05-25 ENCOUNTER — Other Ambulatory Visit: Payer: Medicaid Other

## 2017-05-25 ENCOUNTER — Ambulatory Visit (INDEPENDENT_AMBULATORY_CARE_PROVIDER_SITE_OTHER): Payer: Medicaid Other | Admitting: Certified Nurse Midwife

## 2017-05-25 ENCOUNTER — Encounter: Payer: Self-pay | Admitting: Certified Nurse Midwife

## 2017-05-25 VITALS — BP 114/78 | HR 88 | Wt 208.8 lb

## 2017-05-25 DIAGNOSIS — Z3482 Encounter for supervision of other normal pregnancy, second trimester: Secondary | ICD-10-CM

## 2017-05-25 DIAGNOSIS — O09522 Supervision of elderly multigravida, second trimester: Secondary | ICD-10-CM

## 2017-05-25 DIAGNOSIS — Z3480 Encounter for supervision of other normal pregnancy, unspecified trimester: Secondary | ICD-10-CM

## 2017-05-25 DIAGNOSIS — R7989 Other specified abnormal findings of blood chemistry: Secondary | ICD-10-CM

## 2017-05-25 DIAGNOSIS — B009 Herpesviral infection, unspecified: Secondary | ICD-10-CM

## 2017-05-25 DIAGNOSIS — E559 Vitamin D deficiency, unspecified: Secondary | ICD-10-CM

## 2017-05-25 NOTE — Progress Notes (Signed)
   PRENATAL VISIT NOTE  Subjective:  Julia Chang is a 36 y.o. [id number] at 5834w5d being seen today for ongoing prenatal care.  She is currently monitored for the following issues for this low-risk pregnancy and has Supervision of normal pregnancy, antepartum; AMA (advanced maternal age) multigravida 35+; Chronic tension-type headache, not intractable; HSV-2 infection; and Low vitamin D level on her problem list.  Patient reports no bleeding, no contractions, no cramping, no leaking and weight loss/gain this pregnancy discussed.  EFW on US on 05/07/17: 78%. Patient declines any nausea/vomiting since recent URI.  Contractions: Not present. Vag. Bleeding: None.  Movement: Present. Denies leaking of fluid.   The following portions of the patient's history were reviewed and updated as appropriate: allergies, current medications, past family history, past medical history, past social history, past surgical history and problem list. Problem list updated.  Objective:   Vitals:   05/25/17 0909  BP: 114/78  Pulse: 88  Weight: 208 lb 12.8 oz (94.7 kg)    Fetal Status: Fetal Heart Rate (bpm): 142 Fundal Height: 27 cm Movement: Present     General:  Alert, oriented and cooperative. Patient is in no acute distress.  Skin: Skin is warm and dry. No rash noted.   Cardiovascular: Normal heart rate noted  Respiratory: Normal respiratory effort, no problems with respiration noted  Abdomen: Soft, gravid, appropriate for gestational age. Pain/Pressure: Absent     Pelvic:  Cervical exam deferred        Extremities: Normal range of motion.  Edema: None  Mental Status: Normal mood and affect. Normal behavior. Normal judgment and thought content.   Assessment and Plan:  Pregnancy: G5P4004 at 6734w5d  1. Supervision of other normal pregnancy, antepartum     Doing well - Glucose Tolerance, 2 Hours w/1 Hour - CBC - HIV antibody - RPR  2. Elderly multigravida in second trimester     <40 years  3. HSV-2  infection     Valtrex @36  weeks  4. Low vitamin D level     Taking weekly vitamin D  Preterm labor symptoms and general obstetric precautions including but not limited to vaginal bleeding, contractions, leaking of fluid and fetal movement were reviewed in detail with the patient. Please refer to After Visit Summary for other counseling recommendations.  Return in about 2 weeks (around 06/08/2017) for ROB.   Roe Coombsachelle A Amadu Schlageter, CNM

## 2017-05-25 NOTE — Patient Instructions (Addendum)
Contraception Choices Contraception (birth control) is the use of any methods or devices to prevent pregnancy. Below are some methods to help avoid pregnancy. Hormonal methods  Contraceptive implant. This is a thin, plastic tube containing progesterone hormone. It does not contain estrogen hormone. Your health care provider inserts the tube in the inner part of the upper arm. The tube can remain in place for up to 3 years. After 3 years, the implant must be removed. The implant prevents the ovaries from releasing an egg (ovulation), thickens the cervical mucus to prevent sperm from entering the uterus, and thins the lining of the inside of the uterus.  Progesterone-only injections. These injections are given every 3 months by your health care provider to prevent pregnancy. This synthetic progesterone hormone stops the ovaries from releasing eggs. It also thickens cervical mucus and changes the uterine lining. This makes it harder for sperm to survive in the uterus.  Birth control pills. These pills contain estrogen and progesterone hormone. They work by preventing the ovaries from releasing eggs (ovulation). They also cause the cervical mucus to thicken, preventing the sperm from entering the uterus. Birth control pills are prescribed by a health care provider.Birth control pills can also be used to treat heavy periods.  Minipill. This type of birth control pill contains only the progesterone hormone. They are taken every day of each month and must be prescribed by your health care provider.  Birth control patch. The patch contains hormones similar to those in birth control pills. It must be changed once a week and is prescribed by a health care provider.  Vaginal ring. The ring contains hormones similar to those in birth control pills. It is left in the vagina for 3 weeks, removed for 1 week, and then a new one is put back in place. The patient must be comfortable inserting and removing the ring from  the vagina.A health care provider's prescription is necessary.  Emergency contraception. Emergency contraceptives prevent pregnancy after unprotected sexual intercourse. This pill can be taken right after sex or up to 5 days after unprotected sex. It is most effective the sooner you take the pills after having sexual intercourse. Most emergency contraceptive pills are available without a prescription. Check with your pharmacist. Do not use emergency contraception as your only form of birth control. Barrier methods  Female condom. This is a thin sheath (latex or rubber) that is worn over the penis during sexual intercourse. It can be used with spermicide to increase effectiveness.  Female condom. This is a soft, loose-fitting sheath that is put into the vagina before sexual intercourse.  Diaphragm. This is a soft, latex, dome-shaped barrier that must be fitted by a health care provider. It is inserted into the vagina, along with a spermicidal jelly. It is inserted before intercourse. The diaphragm should be left in the vagina for 6 to 8 hours after intercourse.  Cervical cap. This is a round, soft, latex or plastic cup that fits over the cervix and must be fitted by a health care provider. The cap can be left in place for up to 48 hours after intercourse.  Sponge. This is a soft, circular piece of polyurethane foam. The sponge has spermicide in it. It is inserted into the vagina after wetting it and before sexual intercourse.  Spermicides. These are chemicals that kill or block sperm from entering the cervix and uterus. They come in the form of creams, jellies, suppositories, foam, or tablets. They do not require a prescription. They   are inserted into the vagina with an applicator before having sexual intercourse. The process must be repeated every time you have sexual intercourse. Intrauterine contraception  Intrauterine device (IUD). This is a T-shaped device that is put in a woman's uterus during  a menstrual period to prevent pregnancy. There are 2 types: ? Copper IUD. This type of IUD is wrapped in copper wire and is placed inside the uterus. Copper makes the uterus and fallopian tubes produce a fluid that kills sperm. It can stay in place for 10 years. ? Hormone IUD. This type of IUD contains the hormone progestin (synthetic progesterone). The hormone thickens the cervical mucus and prevents sperm from entering the uterus, and it also thins the uterine lining to prevent implantation of a fertilized egg. The hormone can weaken or kill the sperm that get into the uterus. It can stay in place for 3-5 years, depending on which type of IUD is used. Permanent methods of contraception  Female tubal ligation. This is when the woman's fallopian tubes are surgically sealed, tied, or blocked to prevent the egg from traveling to the uterus.  Hysteroscopic sterilization. This involves placing a small coil or insert into each fallopian tube. Your doctor uses a technique called hysteroscopy to do the procedure. The device causes scar tissue to form. This results in permanent blockage of the fallopian tubes, so the sperm cannot fertilize the egg. It takes about 3 months after the procedure for the tubes to become blocked. You must use another form of birth control for these 3 months.  Female sterilization. This is when the female has the tubes that carry sperm tied off (vasectomy).This blocks sperm from entering the vagina during sexual intercourse. After the procedure, the man can still ejaculate fluid (semen). Natural planning methods  Natural family planning. This is not having sexual intercourse or using a barrier method (condom, diaphragm, cervical cap) on days the woman could become pregnant.  Calendar method. This is keeping track of the length of each menstrual cycle and identifying when you are fertile.  Ovulation method. This is avoiding sexual intercourse during ovulation.  Symptothermal method.  This is avoiding sexual intercourse during ovulation, using a thermometer and ovulation symptoms.  Post-ovulation method. This is timing sexual intercourse after you have ovulated. Regardless of which type or method of contraception you choose, it is important that you use condoms to protect against the transmission of sexually transmitted infections (STIs). Talk with your health care provider about which form of contraception is most appropriate for you. This information is not intended to replace advice given to you by your health care provider. Make sure you discuss any questions you have with your health care provider. Document Released: 11/23/2005 Document Revised: 04/30/2016 Document Reviewed: 05/18/2013 Elsevier Interactive Patient Education  2017 [employer]  

## 2017-05-25 NOTE — Progress Notes (Signed)
Patient is concerned about her weight loss- she was admitted for dehydration last week. Patient reports she is eating and drinking.

## 2017-05-26 ENCOUNTER — Other Ambulatory Visit: Payer: Self-pay | Admitting: Certified Nurse Midwife

## 2017-05-26 DIAGNOSIS — Z3480 Encounter for supervision of other normal pregnancy, unspecified trimester: Secondary | ICD-10-CM

## 2017-05-26 LAB — CBC
Hematocrit: 34.3 % (ref 34.0–46.6)
Hemoglobin: 11.4 g/dL (ref 11.1–15.9)
MCH: 28.9 pg (ref 26.6–33.0)
MCHC: 33.2 g/dL (ref 31.5–35.7)
MCV: 87 fL (ref 79–97)
PLATELETS: 298 10*3/uL (ref 150–379)
RBC: 3.94 x10E6/uL (ref 3.77–5.28)
RDW: 14.5 % (ref 12.3–15.4)
WBC: 8 10*3/uL (ref 3.4–10.8)

## 2017-05-26 LAB — GLUCOSE TOLERANCE, 2 HOURS W/ 1HR
GLUCOSE, 2 HOUR: 101 mg/dL (ref 65–152)
GLUCOSE, FASTING: 88 mg/dL (ref 65–91)
Glucose, 1 hour: 175 mg/dL (ref 65–179)

## 2017-05-26 LAB — HIV ANTIBODY (ROUTINE TESTING W REFLEX): HIV SCREEN 4TH GENERATION: NONREACTIVE

## 2017-05-26 LAB — RPR: RPR: NONREACTIVE

## 2017-06-08 ENCOUNTER — Encounter: Payer: Self-pay | Admitting: Certified Nurse Midwife

## 2017-06-08 ENCOUNTER — Ambulatory Visit (INDEPENDENT_AMBULATORY_CARE_PROVIDER_SITE_OTHER): Payer: Medicaid Other | Admitting: Certified Nurse Midwife

## 2017-06-08 VITALS — BP 119/74 | HR 109 | Wt 213.2 lb

## 2017-06-08 DIAGNOSIS — E559 Vitamin D deficiency, unspecified: Secondary | ICD-10-CM

## 2017-06-08 DIAGNOSIS — Z3483 Encounter for supervision of other normal pregnancy, third trimester: Secondary | ICD-10-CM

## 2017-06-08 DIAGNOSIS — O09522 Supervision of elderly multigravida, second trimester: Secondary | ICD-10-CM

## 2017-06-08 DIAGNOSIS — Z3480 Encounter for supervision of other normal pregnancy, unspecified trimester: Secondary | ICD-10-CM

## 2017-06-08 DIAGNOSIS — O09523 Supervision of elderly multigravida, third trimester: Secondary | ICD-10-CM

## 2017-06-08 DIAGNOSIS — R7989 Other specified abnormal findings of blood chemistry: Secondary | ICD-10-CM

## 2017-06-08 DIAGNOSIS — B009 Herpesviral infection, unspecified: Secondary | ICD-10-CM

## 2017-06-08 NOTE — Progress Notes (Signed)
Patient reports she is doing well 

## 2017-06-08 NOTE — Progress Notes (Signed)
   PRENATAL VISIT NOTE  Subjective:  Julia Chang is a 36 y.o. [id number] at 1554w5d being seen today for ongoing prenatal care.  She is currently monitored for the following issues for this low-risk pregnancy and has Supervision of normal pregnancy, antepartum; AMA (advanced maternal age) multigravida 35+; Chronic tension-type headache, not intractable; HSV-2 infection; and Low vitamin D level on her problem list.  Patient reports no complaints.  Contractions: Not present. Vag. Bleeding: None.  Movement: Present. Denies leaking of fluid.   The following portions of the patient's history were reviewed and updated as appropriate: allergies, current medications, past family history, past medical history, past social history, past surgical history and problem list. Problem list updated.  Objective:   Vitals:   06/08/17 1604  BP: 119/74  Pulse: (!) 109  Weight: 213 lb 3.2 oz (96.7 kg)    Fetal Status: Fetal Heart Rate (bpm): 149 Fundal Height: 30 cm Movement: Present     General:  Alert, oriented and cooperative. Patient is in no acute distress.  Skin: Skin is warm and dry. No rash noted.   Cardiovascular: Normal heart rate noted  Respiratory: Normal respiratory effort, no problems with respiration noted  Abdomen: Soft, gravid, appropriate for gestational age. Pain/Pressure: Absent     Pelvic:  Cervical exam deferred        Extremities: Normal range of motion.  Edema: None  Mental Status: Normal mood and affect. Normal behavior. Normal judgment and thought content.   Assessment and Plan:  Pregnancy: G5P4004 at 7154w5d  1. Supervision of other normal pregnancy, antepartum      Doing well.    2. Elderly multigravida in second trimester     <40 years  3. HSV-2 infection     Valtrex @36  weeks  4. Low vitamin D level     Taking vitamin D weekly.   Preterm labor symptoms and general obstetric precautions including but not limited to vaginal bleeding, contractions, leaking of fluid  and fetal movement were reviewed in detail with the patient. Please refer to After Visit Summary for other counseling recommendations.  Return in about 2 weeks (around 06/22/2017) for ROB.   Roe Coombsachelle A Lanise Mergen, CNM

## 2017-06-28 ENCOUNTER — Encounter: Payer: Self-pay | Admitting: Obstetrics

## 2017-06-28 ENCOUNTER — Ambulatory Visit (INDEPENDENT_AMBULATORY_CARE_PROVIDER_SITE_OTHER): Payer: Medicaid Other | Admitting: Certified Nurse Midwife

## 2017-06-28 ENCOUNTER — Encounter: Payer: Self-pay | Admitting: Certified Nurse Midwife

## 2017-06-28 VITALS — BP 127/81 | HR 102 | Wt 213.0 lb

## 2017-06-28 DIAGNOSIS — E559 Vitamin D deficiency, unspecified: Secondary | ICD-10-CM

## 2017-06-28 DIAGNOSIS — O09523 Supervision of elderly multigravida, third trimester: Secondary | ICD-10-CM

## 2017-06-28 DIAGNOSIS — Z3480 Encounter for supervision of other normal pregnancy, unspecified trimester: Secondary | ICD-10-CM

## 2017-06-28 DIAGNOSIS — Z3482 Encounter for supervision of other normal pregnancy, second trimester: Secondary | ICD-10-CM

## 2017-06-28 DIAGNOSIS — R7989 Other specified abnormal findings of blood chemistry: Secondary | ICD-10-CM

## 2017-06-28 DIAGNOSIS — B009 Herpesviral infection, unspecified: Secondary | ICD-10-CM

## 2017-06-28 NOTE — Progress Notes (Signed)
   PRENATAL VISIT NOTE  Subjective:  Julia Chang is a 36 y.o. [name] at 429w4d being seen today for ongoing prenatal care.  She is currently monitored for the following issues for this low-risk pregnancy and has Supervision of normal pregnancy, antepartum; AMA (advanced maternal age) multigravida 35+; Chronic tension-type headache, not intractable; HSV-2 infection; and Low vitamin D level on her problem list.  Patient reports no complaints.  Contractions: Not present. Vag. Bleeding: None.  Movement: Present. Denies leaking of fluid.   The following portions of the patient's history were reviewed and updated as appropriate: allergies, current medications, past family history, past medical history, past social history, past surgical history and problem list. Problem list updated.  Objective:   Vitals:   06/28/17 1536  BP: 127/81  Pulse: (!) 102  Weight: 213 lb (96.6 kg)    Fetal Status: Fetal Heart Rate (bpm): 152 Fundal Height: 33 cm Movement: Present     General:  Alert, oriented and cooperative. Patient is in no acute distress.  Skin: Skin is warm and dry. No rash noted.   Cardiovascular: Normal heart rate noted  Respiratory: Normal respiratory effort, no problems with respiration noted  Abdomen: Soft, gravid, appropriate for gestational age.  Pain/Pressure: Present     Pelvic: Cervical exam deferred        Extremities: Normal range of motion.     Mental Status:  Normal mood and affect. Normal behavior. Normal judgment and thought content.   Assessment and Plan:  Pregnancy: G5P4004 at 209w4d  1. HSV-2 infection     Valtrex @36  weeks  2. Low vitamin D level     Taking weekly vitamin D  3. Elderly multigravida in third trimester      <36 years of age  864. Supervision of other normal pregnancy, antepartum     Doing well  Preterm labor symptoms and general obstetric precautions including but not limited to vaginal bleeding, contractions, leaking of fluid and fetal  movement were reviewed in detail with the patient. Please refer to After Visit Summary for other counseling recommendations.  Return in about 2 weeks (around 07/12/2017) for ROB.   Roe Coombsachelle A Emerita Berkemeier, CNM

## 2017-07-12 ENCOUNTER — Encounter: Payer: Self-pay | Admitting: Certified Nurse Midwife

## 2017-07-12 ENCOUNTER — Ambulatory Visit (INDEPENDENT_AMBULATORY_CARE_PROVIDER_SITE_OTHER): Payer: Medicaid Other | Admitting: Certified Nurse Midwife

## 2017-07-12 VITALS — BP 125/77 | HR 99 | Wt 216.6 lb

## 2017-07-12 DIAGNOSIS — R7989 Other specified abnormal findings of blood chemistry: Secondary | ICD-10-CM

## 2017-07-12 DIAGNOSIS — E559 Vitamin D deficiency, unspecified: Secondary | ICD-10-CM

## 2017-07-12 DIAGNOSIS — Z3480 Encounter for supervision of other normal pregnancy, unspecified trimester: Secondary | ICD-10-CM

## 2017-07-12 DIAGNOSIS — O09523 Supervision of elderly multigravida, third trimester: Secondary | ICD-10-CM

## 2017-07-12 DIAGNOSIS — Z3483 Encounter for supervision of other normal pregnancy, third trimester: Secondary | ICD-10-CM

## 2017-07-12 NOTE — Progress Notes (Signed)
Patient reports no concerns today 

## 2017-07-12 NOTE — Patient Instructions (Addendum)
Contraception Choices Contraception (birth control) is the use of any methods or devices to prevent pregnancy. Below are some methods to help avoid pregnancy. Hormonal methods  Contraceptive implant. This is a thin, plastic tube containing progesterone hormone. It does not contain estrogen hormone. Your health care provider inserts the tube in the inner part of the upper arm. The tube can remain in place for up to 3 years. After 3 years, the implant must be removed. The implant prevents the ovaries from releasing an egg (ovulation), thickens the cervical mucus to prevent sperm from entering the uterus, and thins the lining of the inside of the uterus.  Progesterone-only injections. These injections are given every 3 months by your health care provider to prevent pregnancy. This synthetic progesterone hormone stops the ovaries from releasing eggs. It also thickens cervical mucus and changes the uterine lining. This makes it harder for sperm to survive in the uterus.  Birth control pills. These pills contain estrogen and progesterone hormone. They work by preventing the ovaries from releasing eggs (ovulation). They also cause the cervical mucus to thicken, preventing the sperm from entering the uterus. Birth control pills are prescribed by a health care provider.Birth control pills can also be used to treat heavy periods.  Minipill. This type of birth control pill contains only the progesterone hormone. They are taken every day of each month and must be prescribed by your health care provider.  Birth control patch. The patch contains hormones similar to those in birth control pills. It must be changed once a week and is prescribed by a health care provider.  Vaginal ring. The ring contains hormones similar to those in birth control pills. It is left in the vagina for 3 weeks, removed for 1 week, and then a new one is put back in place. The patient must be comfortable inserting and removing the ring from  the vagina.A health care provider's prescription is necessary.  Emergency contraception. Emergency contraceptives prevent pregnancy after unprotected sexual intercourse. This pill can be taken right after sex or up to 5 days after unprotected sex. It is most effective the sooner you take the pills after having sexual intercourse. Most emergency contraceptive pills are available without a prescription. Check with your pharmacist. Do not use emergency contraception as your only form of birth control. Barrier methods  Female condom. This is a thin sheath (latex or rubber) that is worn over the penis during sexual intercourse. It can be used with spermicide to increase effectiveness.  Female condom. This is a soft, loose-fitting sheath that is put into the vagina before sexual intercourse.  Diaphragm. This is a soft, latex, dome-shaped barrier that must be fitted by a health care provider. It is inserted into the vagina, along with a spermicidal jelly. It is inserted before intercourse. The diaphragm should be left in the vagina for 6 to 8 hours after intercourse.  Cervical cap. This is a round, soft, latex or plastic cup that fits over the cervix and must be fitted by a health care provider. The cap can be left in place for up to 48 hours after intercourse.  Sponge. This is a soft, circular piece of polyurethane foam. The sponge has spermicide in it. It is inserted into the vagina after wetting it and before sexual intercourse.  Spermicides. These are chemicals that kill or block sperm from entering the cervix and uterus. They come in the form of creams, jellies, suppositories, foam, or tablets. They do not require a prescription. They   are inserted into the vagina with an applicator before having sexual intercourse. The process must be repeated every time you have sexual intercourse. Intrauterine contraception  Intrauterine device (IUD). This is a T-shaped device that is put in a woman's uterus during  a menstrual period to prevent pregnancy. There are 2 types: ? Copper IUD. This type of IUD is wrapped in copper wire and is placed inside the uterus. Copper makes the uterus and fallopian tubes produce a fluid that kills sperm. It can stay in place for 10 years. ? Hormone IUD. This type of IUD contains the hormone progestin (synthetic progesterone). The hormone thickens the cervical mucus and prevents sperm from entering the uterus, and it also thins the uterine lining to prevent implantation of a fertilized egg. The hormone can weaken or kill the sperm that get into the uterus. It can stay in place for 3-5 years, depending on which type of IUD is used. Permanent methods of contraception  Female tubal ligation. This is when the woman's fallopian tubes are surgically sealed, tied, or blocked to prevent the egg from traveling to the uterus.  Hysteroscopic sterilization. This involves placing a small coil or insert into each fallopian tube. Your doctor uses a technique called hysteroscopy to do the procedure. The device causes scar tissue to form. This results in permanent blockage of the fallopian tubes, so the sperm cannot fertilize the egg. It takes about 3 months after the procedure for the tubes to become blocked. You must use another form of birth control for these 3 months.  Female sterilization. This is when the female has the tubes that carry sperm tied off (vasectomy).This blocks sperm from entering the vagina during sexual intercourse. After the procedure, the man can still ejaculate fluid (semen). Natural planning methods  Natural family planning. This is not having sexual intercourse or using a barrier method (condom, diaphragm, cervical cap) on days the woman could become pregnant.  Calendar method. This is keeping track of the length of each menstrual cycle and identifying when you are fertile.  Ovulation method. This is avoiding sexual intercourse during ovulation.  Symptothermal method.  This is avoiding sexual intercourse during ovulation, using a thermometer and ovulation symptoms.  Post-ovulation method. This is timing sexual intercourse after you have ovulated. Regardless of which type or method of contraception you choose, it is important that you use condoms to protect against the transmission of sexually transmitted infections (STIs). Talk with your health care provider about which form of contraception is most appropriate for you. This information is not intended to replace advice given to you by your health care provider. Make sure you discuss any questions you have with your health care provider. Document Released: 11/23/2005 Document Revised: 04/30/2016 Document Reviewed: 05/18/2013 Elsevier Interactive Patient Education  2017 [employer]  

## 2017-07-12 NOTE — Progress Notes (Signed)
   PRENATAL VISIT NOTE  Subjective:  Julia Chang is a 36 y.o. [id number] at 4010w4d being seen today for ongoing prenatal care.  She is currently monitored for the following issues for this low-risk pregnancy and has Supervision of normal pregnancy, antepartum; AMA (advanced maternal age) multigravida 35+; Chronic tension-type headache, not intractable; HSV-2 infection; and Low vitamin D level on her problem list.  Patient reports no complaints.  Contractions: Not present. Vag. Bleeding: None.  Movement: Present. Denies leaking of fluid.   The following portions of the patient's history were reviewed and updated as appropriate: allergies, current medications, past family history, past medical history, past social history, past surgical history and problem list. Problem list updated.  Objective:   Vitals:   07/12/17 1619  BP: 125/77  Pulse: 99  Weight: 216 lb 9.6 oz (98.2 kg)    Fetal Status: Fetal Heart Rate (bpm): 139 Fundal Height: 34 cm Movement: Present     General:  Alert, oriented and cooperative. Patient is in no acute distress.  Skin: Skin is warm and dry. No rash noted.   Cardiovascular: Normal heart rate noted  Respiratory: Normal respiratory effort, no problems with respiration noted  Abdomen: Soft, gravid, appropriate for gestational age.  Pain/Pressure: Absent     Pelvic: Cervical exam deferred        Extremities: Normal range of motion.  Edema: None  Mental Status:  Normal mood and affect. Normal behavior. Normal judgment and thought content.   Assessment and Plan:  Pregnancy: G5P4004 at 6410w4d  1. Supervision of other normal pregnancy, antepartum      Doing well  2. Low vitamin D level     Taking vitamin D  3. Elderly multigravida in third trimester     Age <40 years  Preterm labor symptoms and general obstetric precautions including but not limited to vaginal bleeding, contractions, leaking of fluid and fetal movement were reviewed in detail with the  patient. Please refer to After Visit Summary for other counseling recommendations.  Return in about 1 week (around 07/19/2017) for ROB, GBS.   Roe Coombsachelle A Cherisa Brucker, CNM

## 2017-07-21 ENCOUNTER — Encounter: Payer: Self-pay | Admitting: Certified Nurse Midwife

## 2017-07-21 ENCOUNTER — Other Ambulatory Visit (HOSPITAL_COMMUNITY)
Admission: RE | Admit: 2017-07-21 | Discharge: 2017-07-21 | Disposition: A | Discharge status: Home / Self Care | Payer: Medicaid Other | Source: Ambulatory Visit | Attending: Certified Nurse Midwife | Admitting: Certified Nurse Midwife

## 2017-07-21 ENCOUNTER — Ambulatory Visit (INDEPENDENT_AMBULATORY_CARE_PROVIDER_SITE_OTHER): Payer: Medicaid Other | Admitting: Certified Nurse Midwife

## 2017-07-21 VITALS — BP 123/81 | HR 96 | Wt 214.0 lb

## 2017-07-21 DIAGNOSIS — B009 Herpesviral infection, unspecified: Secondary | ICD-10-CM

## 2017-07-21 DIAGNOSIS — O09523 Supervision of elderly multigravida, third trimester: Secondary | ICD-10-CM

## 2017-07-21 DIAGNOSIS — N898 Other specified noninflammatory disorders of vagina: Secondary | ICD-10-CM

## 2017-07-21 DIAGNOSIS — Z3480 Encounter for supervision of other normal pregnancy, unspecified trimester: Secondary | ICD-10-CM

## 2017-07-21 DIAGNOSIS — E559 Vitamin D deficiency, unspecified: Secondary | ICD-10-CM

## 2017-07-21 DIAGNOSIS — R7989 Other specified abnormal findings of blood chemistry: Secondary | ICD-10-CM

## 2017-07-21 DIAGNOSIS — Z3483 Encounter for supervision of other normal pregnancy, third trimester: Secondary | ICD-10-CM

## 2017-07-21 LAB — OB RESULTS CONSOLE GBS: STREP GROUP B AG: NEGATIVE

## 2017-07-21 MED ORDER — VALACYCLOVIR HCL 1 G PO TABS: 1000.0000 mg | ORAL_TABLET | Freq: Every day | ORAL | 3 refills | Status: DC

## 2017-07-21 NOTE — Progress Notes (Signed)
   PRENATAL VISIT NOTE  Subjective:  Julia Chang is a 36 y.o. [id number] at 622w6d being seen today for ongoing prenatal care.  She is currently monitored for the following issues for this low-risk pregnancy and has Supervision of normal pregnancy, antepartum; AMA (advanced maternal age) multigravida 35+; Chronic tension-type headache, not intractable; HSV-2 infection; and Low vitamin D level on her problem list.  Patient reports no complaints.  Contractions: Not present. Vag. Bleeding: None.  Movement: Present. Denies leaking of fluid.   The following portions of the patient's history were reviewed and updated as appropriate: allergies, current medications, past family history, past medical history, past social history, past surgical history and problem list. Problem list updated.  Objective:   Vitals:   07/21/17 1458  BP: 123/81  Pulse: 96  Weight: 214 lb (97.1 kg)    Fetal Status: Fetal Heart Rate (bpm): 136 Fundal Height: 35 cm Movement: Present  Presentation: Vertex  General:  Alert, oriented and cooperative. Patient is in no acute distress.  Skin: Skin is warm and dry. No rash noted.   Cardiovascular: Normal heart rate noted  Respiratory: Normal respiratory effort, no problems with respiration noted  Abdomen: Soft, gravid, appropriate for gestational age.  Pain/Pressure: Absent     Pelvic: Cervical exam performed Dilation: 1 Effacement (%): Thick Station: -3  Extremities: Normal range of motion.  Edema: None  Mental Status:  Normal mood and affect. Normal behavior. Normal judgment and thought content.   Assessment and Plan:  Pregnancy: G5P4004 at 842w6d  1. Supervision of other normal pregnancy, antepartum     Doing well - Strep Gp B NAA  2. Vaginal discharge       - Cervicovaginal ancillary only  3. HSV-2 infection      Prophylaxis - valACYclovir (VALTREX) 1000 MG tablet; Take 1 tablet (1,000 mg total) by mouth daily.  Dispense: 30 tablet; Refill: 3  4. Low  vitamin D level     Taking weekly vitamin D  5. Elderly multigravida in third trimester     <40 years.   Preterm labor symptoms and general obstetric precautions including but not limited to vaginal bleeding, contractions, leaking of fluid and fetal movement were reviewed in detail with the patient. Please refer to After Visit Summary for other counseling recommendations.  Return in about 1 week (around 07/28/2017) for ROB.   Roe Coombsachelle A Montia Haslip, CNM

## 2017-07-22 LAB — CERVICOVAGINAL ANCILLARY ONLY
Bacterial vaginitis: NEGATIVE
CANDIDA VAGINITIS: NEGATIVE
Chlamydia: NEGATIVE
Neisseria Gonorrhea: NEGATIVE
TRICH (WINDOWPATH): NEGATIVE

## 2017-07-23 LAB — STREP GP B NAA: STREP GROUP B AG: NEGATIVE

## 2017-07-26 ENCOUNTER — Other Ambulatory Visit: Payer: Self-pay | Admitting: Certified Nurse Midwife

## 2017-07-26 DIAGNOSIS — Z3480 Encounter for supervision of other normal pregnancy, unspecified trimester: Secondary | ICD-10-CM

## 2017-07-28 ENCOUNTER — Encounter: Payer: Self-pay | Admitting: Certified Nurse Midwife

## 2017-07-28 ENCOUNTER — Ambulatory Visit (INDEPENDENT_AMBULATORY_CARE_PROVIDER_SITE_OTHER): Payer: Medicaid Other | Admitting: Certified Nurse Midwife

## 2017-07-28 VITALS — BP 131/77 | HR 94 | Wt 220.2 lb

## 2017-07-28 DIAGNOSIS — Z3483 Encounter for supervision of other normal pregnancy, third trimester: Secondary | ICD-10-CM

## 2017-07-28 DIAGNOSIS — Z3480 Encounter for supervision of other normal pregnancy, unspecified trimester: Secondary | ICD-10-CM

## 2017-07-28 DIAGNOSIS — R7989 Other specified abnormal findings of blood chemistry: Secondary | ICD-10-CM

## 2017-07-28 DIAGNOSIS — O09523 Supervision of elderly multigravida, third trimester: Secondary | ICD-10-CM

## 2017-07-28 DIAGNOSIS — E559 Vitamin D deficiency, unspecified: Secondary | ICD-10-CM

## 2017-07-28 NOTE — Progress Notes (Signed)
   PRENATAL VISIT NOTE  Subjective:  Julia Chang is a 36 y.o. E5U3149 at [redacted]w[redacted]d being seen today for ongoing prenatal care.  She is currently monitored for the following issues for this low-risk pregnancy and has Supervision of normal pregnancy, antepartum; AMA (advanced maternal age) multigravida 35+; Chronic tension-type headache, not intractable; HSV-2 infection; and Low vitamin D level on her problem list.  Patient reports no complaints.  Contractions: Irregular. Vag. Bleeding: None.  Movement: Present. Denies leaking of fluid.   The following portions of the patient's history were reviewed and updated as appropriate: allergies, current medications, past family history, past medical history, past social history, past surgical history and problem list. Problem list updated.  Objective:   Vitals:   07/28/17 1603  BP: 131/77  Pulse: 94  Weight: 220 lb 3.2 oz (99.9 kg)    Fetal Status: Fetal Heart Rate (bpm): 132 Fundal Height: 38 cm Movement: Present     General:  Alert, oriented and cooperative. Patient is in no acute distress.  Skin: Skin is warm and dry. No rash noted.   Cardiovascular: Normal heart rate noted  Respiratory: Normal respiratory effort, no problems with respiration noted  Abdomen: Soft, gravid, appropriate for gestational age.  Pain/Pressure: Absent     Pelvic: Cervical exam deferred        Extremities: Normal range of motion.  Edema: None  Mental Status:  Normal mood and affect. Normal behavior. Normal judgment and thought content.   Assessment and Plan:  Pregnancy: G5P4004 at [redacted]w[redacted]d  1. Low vitamin D level     Taking weekly vitamin D  2. Elderly multigravida in third trimester      <64 years of age  3. Supervision of other normal pregnancy, antepartum      Doing well.  Decided on BTL for contraception.  Paperwork completed 07/28/17.   Preterm labor symptoms and general obstetric precautions including but not limited to vaginal bleeding, contractions,  leaking of fluid and fetal movement were reviewed in detail with the patient. Please refer to After Visit Summary for other counseling recommendations.  Return in about 1 week (around 08/04/2017) for ROB.   Roe Coombs, CNM

## 2017-08-03 ENCOUNTER — Other Ambulatory Visit: Payer: Self-pay | Admitting: Certified Nurse Midwife

## 2017-08-04 ENCOUNTER — Ambulatory Visit (INDEPENDENT_AMBULATORY_CARE_PROVIDER_SITE_OTHER): Payer: Medicaid Other | Admitting: Certified Nurse Midwife

## 2017-08-04 ENCOUNTER — Encounter: Payer: Self-pay | Admitting: Certified Nurse Midwife

## 2017-08-04 VITALS — BP 121/79 | HR 92 | Wt 223.5 lb

## 2017-08-04 DIAGNOSIS — R7989 Other specified abnormal findings of blood chemistry: Secondary | ICD-10-CM

## 2017-08-04 DIAGNOSIS — Z3480 Encounter for supervision of other normal pregnancy, unspecified trimester: Secondary | ICD-10-CM

## 2017-08-04 DIAGNOSIS — B009 Herpesviral infection, unspecified: Secondary | ICD-10-CM

## 2017-08-04 DIAGNOSIS — E559 Vitamin D deficiency, unspecified: Secondary | ICD-10-CM

## 2017-08-04 DIAGNOSIS — O09523 Supervision of elderly multigravida, third trimester: Secondary | ICD-10-CM

## 2017-08-04 NOTE — Progress Notes (Signed)
   PRENATAL VISIT NOTE  Subjective:  Julia Chang is a 36 y.o. A1P3790 at [redacted]w[redacted]d being seen today for ongoing prenatal care.  She is currently monitored for the following issues for this low-risk pregnancy and has Supervision of normal pregnancy, antepartum; AMA (advanced maternal age) multigravida 35+; Chronic tension-type headache, not intractable; HSV-2 infection; and Low vitamin D level on her problem list.  Patient reports no complaints.  Contractions: Irritability. Vag. Bleeding: None.  Movement: Present. Denies leaking of fluid.   The following portions of the patient's history were reviewed and updated as appropriate: allergies, current medications, past family history, past medical history, past social history, past surgical history and problem list. Problem list updated.  Objective:   Vitals:   08/04/17 1516  BP: 121/79  Pulse: 92  Weight: 223 lb 8 oz (101.4 kg)    Fetal Status: Fetal Heart Rate (bpm): 159; doppler Fundal Height: 39 cm Movement: Present     General:  Alert, oriented and cooperative. Patient is in no acute distress.  Skin: Skin is warm and dry. No rash noted.   Cardiovascular: Normal heart rate noted  Respiratory: Normal respiratory effort, no problems with respiration noted  Abdomen: Soft, gravid, appropriate for gestational age.  Pain/Pressure: Absent     Pelvic: Cervical exam deferred        Extremities: Normal range of motion.  Edema: None  Mental Status:  Normal mood and affect. Normal behavior. Normal judgment and thought content.   Assessment and Plan:  Pregnancy: G5P4004 at [redacted]w[redacted]d  1. Elderly multigravida in third trimester     < 40 years  2. HSV-2 infection     On Valtrex  3. Low vitamin D level     Taking weekly vitamin D  4. Supervision of other normal pregnancy, antepartum      Doing well  Preterm labor symptoms and general obstetric precautions including but not limited to vaginal bleeding, contractions, leaking of fluid and fetal  movement were reviewed in detail with the patient. Please refer to After Visit Summary for other counseling recommendations.  Return in about 1 week (around 08/11/2017) for ROB.   Roe Coombs, CNM

## 2017-08-09 ENCOUNTER — Inpatient Hospital Stay (HOSPITAL_COMMUNITY)
Admission: AD | Admit: 2017-08-09 | Discharge: 2017-08-11 | DRG: 774 | Disposition: A | Discharge status: Home / Self Care | Payer: Medicaid Other | Source: Ambulatory Visit | Attending: Obstetrics & Gynecology | Admitting: Obstetrics & Gynecology

## 2017-08-09 ENCOUNTER — Inpatient Hospital Stay (HOSPITAL_COMMUNITY): Payer: Medicaid Other | Admitting: Anesthesiology

## 2017-08-09 ENCOUNTER — Encounter (HOSPITAL_COMMUNITY): Payer: Self-pay

## 2017-08-09 DIAGNOSIS — A6000 Herpesviral infection of urogenital system, unspecified: Secondary | ICD-10-CM | POA: Diagnosis present

## 2017-08-09 DIAGNOSIS — Z3A38 38 weeks gestation of pregnancy: Secondary | ICD-10-CM | POA: Diagnosis not present

## 2017-08-09 DIAGNOSIS — O9832 Other infections with a predominantly sexual mode of transmission complicating childbirth: Secondary | ICD-10-CM | POA: Diagnosis present

## 2017-08-09 DIAGNOSIS — O26893 Other specified pregnancy related conditions, third trimester: Secondary | ICD-10-CM | POA: Diagnosis present

## 2017-08-09 LAB — CBC
HCT: 34.1 % — ABNORMAL LOW (ref 36.0–46.0)
HEMOGLOBIN: 12.1 g/dL (ref 12.0–15.0)
MCH: 30.7 pg (ref 26.0–34.0)
MCHC: 35.5 g/dL (ref 30.0–36.0)
MCV: 86.5 fL (ref 78.0–100.0)
Platelets: 313 10*3/uL (ref 150–400)
RBC: 3.94 MIL/uL (ref 3.87–5.11)
RDW: 14.3 % (ref 11.5–15.5)
WBC: 9.7 10*3/uL (ref 4.0–10.5)

## 2017-08-09 LAB — TYPE AND SCREEN
ABO/RH(D): O POS
ANTIBODY SCREEN: NEGATIVE

## 2017-08-09 MED ORDER — OXYCODONE-ACETAMINOPHEN 5-325 MG PO TABS: 1.0000 | ORAL_TABLET | ORAL | Status: DC | PRN

## 2017-08-09 MED ORDER — ACETAMINOPHEN 325 MG PO TABS: 650.0000 mg | ORAL_TABLET | ORAL | Status: DC | PRN

## 2017-08-09 MED ORDER — EPHEDRINE 5 MG/ML INJ
10.0000 mg | INTRAVENOUS | Status: DC | PRN
  Filled 2017-08-09: qty 2

## 2017-08-09 MED ORDER — LIDOCAINE HCL (PF) 1 % IJ SOLN
INTRAMUSCULAR | Status: DC | PRN
  Administered 2017-08-09 (×2): 5 mL via EPIDURAL

## 2017-08-09 MED ORDER — LACTATED RINGERS IV SOLN: 500.0000 mL | INTRAVENOUS | Status: DC | PRN

## 2017-08-09 MED ORDER — LACTATED RINGERS IV SOLN
500.0000 mL | Freq: Once | INTRAVENOUS | Status: AC
  Administered 2017-08-09: 500 mL via INTRAVENOUS

## 2017-08-09 MED ORDER — SOD CITRATE-CITRIC ACID 500-334 MG/5ML PO SOLN: 30.0000 mL | ORAL | Status: DC | PRN

## 2017-08-09 MED ORDER — PHENYLEPHRINE 40 MCG/ML (10ML) SYRINGE FOR IV PUSH (FOR BLOOD PRESSURE SUPPORT)
80.0000 ug | PREFILLED_SYRINGE | INTRAVENOUS | Status: DC | PRN
  Filled 2017-08-09: qty 5

## 2017-08-09 MED ORDER — FENTANYL 2.5 MCG/ML BUPIVACAINE 1/10 % EPIDURAL INFUSION (WH - ANES)
14.0000 mL/h | INTRAMUSCULAR | Status: DC | PRN
  Administered 2017-08-09: 14 mL/h via EPIDURAL
  Filled 2017-08-09: qty 100

## 2017-08-09 MED ORDER — OXYTOCIN BOLUS FROM INFUSION: 500.0000 mL | Freq: Once | INTRAVENOUS | Status: DC

## 2017-08-09 MED ORDER — FENTANYL CITRATE (PF) 100 MCG/2ML IJ SOLN
100.0000 ug | INTRAMUSCULAR | Status: DC | PRN
  Administered 2017-08-09: 100 ug via INTRAVENOUS
  Filled 2017-08-09: qty 2

## 2017-08-09 MED ORDER — DIPHENHYDRAMINE HCL 50 MG/ML IJ SOLN: 12.5000 mg | INTRAMUSCULAR | Status: DC | PRN

## 2017-08-09 MED ORDER — OXYTOCIN 40 UNITS IN LACTATED RINGERS INFUSION - SIMPLE MED
2.5000 [IU]/h | INTRAVENOUS | Status: DC
  Filled 2017-08-09: qty 1000

## 2017-08-09 MED ORDER — PHENYLEPHRINE 40 MCG/ML (10ML) SYRINGE FOR IV PUSH (FOR BLOOD PRESSURE SUPPORT)
80.0000 ug | PREFILLED_SYRINGE | INTRAVENOUS | Status: DC | PRN
  Filled 2017-08-09: qty 10
  Filled 2017-08-09: qty 5

## 2017-08-09 MED ORDER — OXYCODONE-ACETAMINOPHEN 5-325 MG PO TABS: 2.0000 | ORAL_TABLET | ORAL | Status: DC | PRN

## 2017-08-09 MED ORDER — LIDOCAINE HCL (PF) 1 % IJ SOLN
30.0000 mL | INTRAMUSCULAR | Status: DC | PRN
Start: 2017-08-09 — End: 2017-08-10
  Filled 2017-08-09: qty 30

## 2017-08-09 MED ORDER — ONDANSETRON HCL 4 MG/2ML IJ SOLN: 4.0000 mg | Freq: Four times a day (QID) | INTRAMUSCULAR | Status: DC | PRN

## 2017-08-09 MED ORDER — LACTATED RINGERS IV SOLN
INTRAVENOUS | Status: DC
  Administered 2017-08-09: 20:00:00 via INTRAVENOUS

## 2017-08-09 NOTE — H&P (Signed)
LABOR ADMISSION HISTORY AND PHYSICAL  Julia Chang is a 36 y.o. female [phone] with IUP at 5175w4d by LMP presenting for SOL. She reports +FMs, No LOF, no VB, no blurry vision, no headaches, no peripheral edema, and no RUQ pain.  She plans on breast feeding. She requests interval BTL for birth control (papers signed 07/28/17).  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 NO PAST SURGERIES      Obstetrical History: OB History    Gravida Para Term Preterm AB Living   5 4 4     4    SAB TAB Ectopic Multiple Live Births           4      Social History: Social History   Social History  . Marital status: Single    Spouse name: N/A  . Number of children: N/A  . Years of education: N/A   Social History Main Topics  . Smoking status: Never Smoker  . Smokeless tobacco: Never Used  . Alcohol use No  . Drug use: No  . Sexual activity: Not Currently    Birth control/ protection: Injection   Other Topics Concern  . Not on file   Social History Narrative  . No narrative on file    Family History: Family History  Problem Relation Age of Onset  . Diabetes Mother   . Anesthesia problems Neg Hx   . Hypotension Neg Hx   . Malignant hyperthermia Neg Hx   . Pseudochol deficiency Neg Hx   . Other Neg Hx     Allergies: No Known Allergies  Prescriptions Prior to Admission  Medication Sig Dispense Refill Last Dose  . Prenat-FeAsp-Meth-FA-DHA w/o A (PRENATE PIXIE) 10-0.6-0.4-200 MG CAPS Take 1 tablet by mouth daily. 30 capsule 12  08/08/2017 at Unknown time  . valACYclovir (VALTREX) 1000 MG tablet Take 1 tablet (1,000 mg total) by mouth daily. 30 tablet 3 08/08/2017 at Unknown time  . Elastic Bandages & Supports (COMFORT FIT MATERNITY SUPP LG) MISC 1 Units by Does not apply route daily. 1 each 0 Taking  . Vitamin D, Ergocalciferol, (DRISDOL) 50000 units CAPS capsule Take 1 capsule (50,000 Units total) by mouth every 7 (seven) days. (Patient taking differently: Take 50,000 Units by mouth every 7 (seven) days. thursdays) 30 capsule 2 08/05/2017     Review of Systems  All systems reviewed and negative except as stated in HPI  Blood pressure 123/76, pulse (!) 125, temperature 98.6 F (37 C), temperature source Oral, resp. rate 18, height 5\' 6"  (1.676 m), weight 99.8 kg (220 lb), last menstrual period 11/12/2016, unknown if currently breastfeeding. General appearance: alert, cooperative, appears stated age and no distress Lungs: normal work of breathing Extremities: Homans sign is negative, no sign of DVT Presentation: cephalic Fetal monitoringBaseline: 145 bpm, Variability: Good {> 6 bpm), Accelerations: Reactive and Decelerations: Absent Uterine activityFrequency: Every 8 minutes Dilation: 4 Effacement (%): 80 Station: -3 Exam by:: Millner, RN    Prenatal labs: ABO, Rh: O/Positive/-- (02/27 1528) Antibody: Negative (02/27 1528) Rubella: Immune (  02/27 1528) RPR: Non Reactive (06/19 1102)  HBsAg: Negative (02/27 1528)  HIV:  Non Reactive GBS: Negative (08/15 1528)  GTT: Third trimester: wnl  Prenatal Transfer Tool  Maternal Diabetes: No Genetic Screening: Normal Maternal Ultrasounds/Referrals: Normal Fetal Ultrasounds or other Referrals:  Referred to Materal Fetal Medicine - normal Maternal Substance Abuse:  No Significant Maternal Medications:  Meds include: Other: Valtrex Significant Maternal Lab Results: None  Results for orders placed or performed during the hospital encounter of 08/09/17 (from the past 24  hour(s))  CBC   Collection Time: 08/09/17  5:00 PM  Result Value Ref Range   WBC 9.7 4.0 - 10.5 K/uL   RBC 3.94 3.87 - 5.11 MIL/uL   Hemoglobin 12.1 12.0 - 15.0 g/dL   HCT 11.9 (L) 14.7 - 82.9 %   MCV 86.5 78.0 - 100.0 fL   MCH 30.7 26.0 - 34.0 pg   MCHC 35.5 30.0 - 36.0 g/dL   RDW 56.2 13.0 - 86.5 %   Platelets 313 150 - 400 K/uL  Type and screen [facility]   Collection Time: 08/09/17  5:00 PM  Result Value Ref Range   ABO/RH(D) O POS    Antibody Screen PENDING    Sample Expiration 08/12/2017     Patient Active Problem List   Diagnosis Date Noted  . Low vitamin D level 02/10/2017  . Supervision of normal pregnancy, antepartum 02/02/2017  . AMA (advanced maternal age) multigravida 35+ 02/02/2017  . Chronic tension-type headache, not intractable 02/02/2017  . HSV-2 infection     Assessment: Julia Chang is a 36 y.o. [id number] at [redacted]w[redacted]d here for SOL.   #Labor:will likely progress without need for augmentation, but pitocin may be given as needed. Expectant management of SVD.  #Pain: Epidural upon request #FWB: Cat I #ID: GBS neg #MOF: breast #MOC: interval BTL #Circ: Yes, outpatient  Lezlie Octave, MD Family Medicine Resident PGY-1  08/09/2017, 4:37 PM  OB FELLOW HISTORY AND PHYSICAL ATTESTATION  I confirm that I have verified the information documented in the resident's note and that I have also personally reperformed the physical exam and all medical decision making activities. I agree with above documentation and have made edits as needed.   Caryl Ada OB Fellow 08/09/2017, 6:43 PM

## 2017-08-09 NOTE — Anesthesia Procedure Notes (Signed)
Epidural Patient location during procedure: OB Start time: 08/09/2017 6:55 PM End time: 08/09/2017 7:05 PM  Staffing Anesthesiologist: Khori Underberg  Preanesthetic Checklist Completed: patient identified, site marked, surgical consent, pre-op evaluation, timeout performed, IV checked, risks and benefits discussed and monitors and equipment checked  Epidural Patient position: sitting Prep: site prepped and draped and DuraPrep Patient monitoring: continuous pulse ox and blood pressure Approach: midline Location: L3-L4 Injection technique: LOR air  Needle:  Needle type: Tuohy  Needle gauge: 17 G Needle length: 9 cm and 9 Needle insertion depth: 9 cm Catheter type: closed end flexible Catheter size: 19 Gauge Catheter at skin depth: 14 cm Test dose: negative  Assessment Events: blood not aspirated, injection not painful, no injection resistance, negative IV test and no paresthesia

## 2017-08-09 NOTE — Progress Notes (Signed)
Labor Progress Note  S: Patient seen & examined for progress of labor. Patient very uncomfortable, requesting epidural.   O: BP 125/82   Pulse 87   Temp 98.3 F (36.8 C) (Oral)   Resp 18   Ht 5\' 6"  (1.676 m)   Wt 99.8 kg (220 lb)   LMP 11/12/2016   SpO2 98%   BMI 35.51 kg/m   FHT: 140bpm, mod var, +accels, no decels TOCO: q784min, patient looks comfortable during contractions  CVE: Dilation: 4 Effacement (%): 80 Cervical Position: Middle Station: -3 Presentation: Vertex Exam by:: Millner, RN   A&P: 36 y.o. [id number] 5341w4d here for SOL.  Currently not on pitocin; will check patient's cervix after epidural is placed to determine if further augmentation is necessary Continue expectant management Anticipate SVD  Lezlie OctaveAmanda Ona Roehrs, MD [facility] Resident PGY-1 08/09/2017 6:34 PM

## 2017-08-09 NOTE — Anesthesia Pain Management Evaluation Note (Signed)
  CRNA Pain Management Visit Note  Patient: Julia Chang, 36 y.o., female  "Hello I am a member of the anesthesia team at [facility]. We have an anesthesia team available at all times to provide care throughout the hospital, including epidural management and anesthesia for C-section. I don't know your plan for the delivery whether it a natural birth, water birth, IV sedation, nitrous supplementation, doula or epidural, but we want to meet your pain goals."   1.Was your pain managed to your expectations on prior hospitalizations?   Yes   2.What is your expectation for pain management during this hospitalization?     Epidural  3.How can we help you reach that goal? Pt getting epidural placed currently  Record the patient's initial score and the patient's pain goal.   Pain: 10  Pain Goal: 10 [facility] wants you to be able to say your pain was always managed very well.  Deeanne Deininger 08/09/2017

## 2017-08-09 NOTE — Anesthesia Preprocedure Evaluation (Signed)
Anesthesia Evaluation  Patient identified by MRN, date of birth, ID band Patient awake    Reviewed: Allergy & Precautions, H&P , NPO status , Patient's Chart, lab work & pertinent test results, reviewed documented beta blocker date and time   Airway Mallampati: III  TM Distance: >3 FB Neck ROM: full    Dental no notable dental hx.    Pulmonary neg pulmonary ROS,    Pulmonary exam normal breath sounds clear to auscultation       Cardiovascular negative cardio ROS Normal cardiovascular exam Rhythm:regular Rate:Normal     Neuro/Psych negative neurological ROS  negative psych ROS   GI/Hepatic negative GI ROS, Neg liver ROS,   Endo/Other  negative endocrine ROS  Renal/GU negative Renal ROS  negative genitourinary   Musculoskeletal   Abdominal   Peds  Hematology negative hematology ROS (+)   Anesthesia Other Findings   Reproductive/Obstetrics (+) Pregnancy                             Anesthesia Physical Anesthesia Plan  ASA: III  Anesthesia Plan: Epidural   Post-op Pain Management:    Induction:   PONV Risk Score and Plan:   Airway Management Planned:   Additional Equipment:   Intra-op Plan:   Post-operative Plan:   Informed Consent: I have reviewed the patients History and Physical, chart, labs and discussed the procedure including the risks, benefits and alternatives for the proposed anesthesia with the patient or authorized representative who has indicated his/her understanding and acceptance.     Plan Discussed with:   Anesthesia Plan Comments:         Anesthesia Quick Evaluation

## 2017-08-10 LAB — RPR: RPR: NONREACTIVE

## 2017-08-10 LAB — BIRTH TISSUE RECOVERY COLLECTION (PLACENTA DONATION)

## 2017-08-10 MED ORDER — DIBUCAINE 1 % RE OINT: 1.0000 "application " | TOPICAL_OINTMENT | RECTAL | Status: DC | PRN

## 2017-08-10 MED ORDER — ACETAMINOPHEN 325 MG PO TABS: 650.0000 mg | ORAL_TABLET | ORAL | Status: DC | PRN

## 2017-08-10 MED ORDER — IBUPROFEN 600 MG PO TABS
600.0000 mg | ORAL_TABLET | Freq: Four times a day (QID) | ORAL | Status: DC
  Administered 2017-08-10 – 2017-08-11 (×7): 600 mg via ORAL
  Filled 2017-08-10 (×7): qty 1

## 2017-08-10 MED ORDER — ZOLPIDEM TARTRATE 5 MG PO TABS: 5.0000 mg | ORAL_TABLET | Freq: Every evening | ORAL | Status: DC | PRN

## 2017-08-10 MED ORDER — PRENATAL MULTIVITAMIN CH
1.0000 | ORAL_TABLET | Freq: Every day | ORAL | Status: DC
  Administered 2017-08-10 – 2017-08-11 (×2): 1 via ORAL
  Filled 2017-08-10 (×2): qty 1

## 2017-08-10 MED ORDER — BENZOCAINE-MENTHOL 20-0.5 % EX AERO: 1.0000 "application " | INHALATION_SPRAY | CUTANEOUS | Status: DC | PRN

## 2017-08-10 MED ORDER — COCONUT OIL OIL: 1.0000 "application " | TOPICAL_OIL | Status: DC | PRN

## 2017-08-10 MED ORDER — SENNOSIDES-DOCUSATE SODIUM 8.6-50 MG PO TABS
2.0000 | ORAL_TABLET | ORAL | Status: DC
  Administered 2017-08-10 – 2017-08-11 (×2): 2 via ORAL
  Filled 2017-08-10 (×2): qty 2

## 2017-08-10 MED ORDER — ONDANSETRON HCL 4 MG PO TABS: 4.0000 mg | ORAL_TABLET | ORAL | Status: DC | PRN

## 2017-08-10 MED ORDER — ONDANSETRON HCL 4 MG/2ML IJ SOLN: 4.0000 mg | INTRAMUSCULAR | Status: DC | PRN

## 2017-08-10 MED ORDER — SIMETHICONE 80 MG PO CHEW: 80.0000 mg | CHEWABLE_TABLET | ORAL | Status: DC | PRN

## 2017-08-10 MED ORDER — WITCH HAZEL-GLYCERIN EX PADS
1.0000 | MEDICATED_PAD | CUTANEOUS | Status: DC | PRN
Start: 2017-08-10 — End: 2017-08-11

## 2017-08-10 MED ORDER — DIPHENHYDRAMINE HCL 25 MG PO CAPS: 25.0000 mg | ORAL_CAPSULE | Freq: Four times a day (QID) | ORAL | Status: DC | PRN

## 2017-08-10 MED ORDER — TETANUS-DIPHTH-ACELL PERTUSSIS 5-2.5-18.5 LF-MCG/0.5 IM SUSP
0.5000 mL | Freq: Once | INTRAMUSCULAR | Status: AC
  Administered 2017-08-10: 0.5 mL via INTRAMUSCULAR

## 2017-08-10 NOTE — Addendum Note (Signed)
Addendum  created 08/10/17 0744 by Earmon PhoenixWilkerson, Saber Dickerman P, CRNA   Charge Capture section accepted, Sign clinical note

## 2017-08-10 NOTE — Anesthesia Postprocedure Evaluation (Signed)
Anesthesia Post Note  Patient: Julia GreenAnnetta C Torain  Procedure(s) Performed: * No procedures listed *     Patient location during evaluation: Mother Baby Anesthesia Type: Epidural Level of consciousness: awake and alert Pain management: pain level controlled Vital Signs Assessment: post-procedure vital signs reviewed and stable Respiratory status: spontaneous breathing, nonlabored ventilation and respiratory function stable Cardiovascular status: stable Postop Assessment: no headache, no backache and epidural receding Anesthetic complications: no    Last Vitals:  Vitals:   08/10/17 0015 08/10/17 0120  BP: 133/71 122/68  Pulse: 89 82  Resp: 16 18  Temp: 37 C 37.2 C  SpO2:      Last Pain:  Vitals:   08/10/17 0120  TempSrc: Oral  PainSc:    Pain Goal: Patients Stated Pain Goal: 10 (08/09/17 1732)               Skyllar Notarianni

## 2017-08-10 NOTE — Progress Notes (Signed)
Post Partum Day 1 Subjective: She is up moving around without issue, minimal lochia, breast feeding without issue, no pain, tolerating regular diet    Objective: Blood pressure 103/61, pulse 67, temperature 98.5 F (36.9 C), temperature source Oral, resp. rate 18, height 5\' 6"  (1.676 m), weight 99.8 kg (220 lb), last menstrual period 11/12/2016, SpO2 100 %, unknown if currently breastfeeding.  Physical Exam:  General: alert and cooperative Lochia: appropriate Uterine Fundus: firm Incision: N/A DVT Evaluation: No evidence of DVT seen on physical exam.   Recent Labs  08/09/17 1700  HGB 12.1  HCT 34.1*    Assessment/Plan: G5 now P5 ppd#1 from SVD, doing well.  She is breast feeding.  Plans for interval tubal ligation. Continue routine postpartum care.  Discharge likely tomorrow pending continued clinical progress.    LOS: 1 day   Larene BeachMary K Khyan Oats 08/10/2017, 7:54 AM

## 2017-08-10 NOTE — Anesthesia Postprocedure Evaluation (Signed)
Anesthesia Post Note  Patient: Julia GreenAnnetta C Chang  Procedure(s) Performed: * No procedures listed *     Patient location during evaluation: Mother Baby Anesthesia Type: Epidural Level of consciousness: awake Pain management: pain level controlled Vital Signs Assessment: post-procedure vital signs reviewed and stable Respiratory status: spontaneous breathing Cardiovascular status: stable Postop Assessment: no headache, no backache, epidural receding and patient able to bend at knees Anesthetic complications: no    Last Vitals:  Vitals:   08/10/17 0120 08/10/17 0515  BP: 122/68 103/61  Pulse: 82 67  Resp: 18 18  Temp: 37.2 C 36.9 C  SpO2:      Last Pain:  Vitals:   08/10/17 0515  TempSrc: Oral  PainSc: 0-No pain   Pain Goal: Patients Stated Pain Goal: 10 (08/09/17 1732)               Edison PaceWILKERSON,Avaree Gilberti

## 2017-08-11 ENCOUNTER — Encounter: Payer: Medicaid Other | Admitting: Certified Nurse Midwife

## 2017-08-11 MED ORDER — IBUPROFEN 600 MG PO TABS
600.0000 mg | ORAL_TABLET | Freq: Four times a day (QID) | ORAL | 0 refills | Status: DC
Start: 2017-08-11 — End: 2017-09-16

## 2017-08-11 NOTE — Lactation Note (Signed)
This note was copied from a baby's chart. Lactation Consultation Note  Patient Name: Julia Chang's Date: 08/11/2017 Reason for consult: Follow-up assessment   Baby 37 hours old.  Baby cueing. Mother latched baby in cradle position.  Sucks and swallows observed. Praised mother for her efforts. Mom encouraged to feed baby 8-12 times/24 hours and with feeding cues.  Reviewed engorgement care and monitoring voids/stools. Provided mother w/ manual pump.   Maternal Data    Feeding Feeding Type: Breast Fed Length of feed: 15 min  LATCH Score Latch: Grasps breast easily, tongue down, lips flanged, rhythmical sucking.  Audible Swallowing: Spontaneous and intermittent  Type of Nipple: Everted at rest and after stimulation  Comfort (Breast/Nipple): Soft / non-tender  Hold (Positioning): No assistance needed to correctly position infant at breast.  LATCH Score: 10  Interventions    Lactation Tools Discussed/Used     Consult Status Consult Status: Complete    Hardie PulleyBerkelhammer, Navah Grondin Boschen 08/11/2017, 11:36 AM

## 2017-08-11 NOTE — Discharge Instructions (Signed)

## 2017-08-11 NOTE — Discharge Summary (Signed)
OB Discharge Summary     Patient Name: Julia Chang DOB: August 04, 1981 MRN: [id number]  Date of admissioLeonie Chang: 08/09/2017 Delivering MD: Caryl AdaPHELPS, JAZMA Y   Date of discharge: 08/11/2017  Admitting diagnosis: 38wks labor, ctx 5 mins apart Intrauterine pregnancy: 8423w4d     Secondary diagnosis:  Active Problems:   Normal labor   SVD (spontaneous vaginal delivery)  Additional problems: none      Discharge diagnosis: Term Pregnancy Delivered                                                                                                Post partum procedures:none  Augmentation: none  Complications: None  Hospital course:  Onset of Labor With Vaginal Delivery     36 y.o. yo G5P5005 at 1223w4d was admitted in Active Labor on 08/09/2017. Patient had an uncomplicated labor course as follows:  Membrane Rupture Time/Date: 7:18 PM ,08/09/2017   Intrapartum Procedures: Episiotomy: None [1]                                         Lacerations:  None [1]  Patient had a delivery of a Viable infant. 08/09/2017  Information for the patient's newborn:  Julia Chang, Boy Julia Chang [[id number]][[id number]]  Delivery Method: Vag-Spont    Pateint had an uncomplicated postpartum course.  She is ambulating, tolerating a regular diet, passing flatus, and urinating well. Patient is discharged home in stable condition on 08/11/17.   Physical exam  Vitals:   08/10/17 0120 08/10/17 0515 08/10/17 1729 08/11/17 0611  BP: 122/68 103/61 136/78 (!) 109/58  Pulse: 82 67 98 75  Resp: 18 18 18 18   Temp: 99 F (37.2 C) 98.5 F (36.9 C) 98.9 F (37.2 C) 97.9 F (36.6 C)  TempSrc: Oral Oral Oral Oral  SpO2:      Weight:      Height:       General: alert, cooperative and no distress Lochia: appropriate Uterine Fundus: firm Incision: N/A DVT Evaluation: No evidence of DVT seen on physical exam. Labs: Lab Results  Component Value Date   WBC 9.7 08/09/2017   HGB 12.1 08/09/2017   HCT 34.1 (L) 08/09/2017   MCV 86.5 08/09/2017    PLT 313 08/09/2017   CMP Latest Ref Rng & Units 05/20/2017  Glucose 65 - 99 mg/dL 782(N101(H)  BUN 6 - 20 mg/dL 5(L)  Creatinine 5.620.44 - 1.00 mg/dL 1.300.63  Sodium 865135 - 784145 mmol/L 134(L)  Potassium 3.5 - 5.1 mmol/L 3.5  Chloride 101 - 111 mmol/L 105  CO2 22 - 32 mmol/L 21(L)  Calcium 8.9 - 10.3 mg/dL 6.9(G8.7(L)  Total Protein 6.0 - 8.3 g/dL -  Total Bilirubin 0.3 - 1.2 mg/dL -  Alkaline Phos 39 - 295117 U/L -  AST 0 - 37 U/L -  ALT 0 - 35 U/L -    Discharge instruction: per After Visit Summary and "Baby and Me Booklet".  After visit meds:  Allergies as of 08/11/2017   No Known Allergies  Medication List    TAKE these medications   COMFORT FIT MATERNITY SUPP LG Misc 1 Units by Does not apply route daily.   ibuprofen 600 MG tablet Commonly known as:  ADVIL,MOTRIN Take 1 tablet (600 mg total) by mouth every 6 (six) hours.   PRENATE PIXIE 10-0.6-0.4-200 MG Caps Take 1 tablet by mouth daily.   valACYclovir 1000 MG tablet Commonly known as:  VALTREX Take 1 tablet (1,000 mg total) by mouth daily.   Vitamin D (Ergocalciferol) 50000 units Caps capsule Commonly known as:  DRISDOL Take 1 capsule (50,000 Units total) by mouth every 7 (seven) days. What changed:  additional instructions            Discharge Care Instructions        Start     Ordered   08/11/17 0000  ibuprofen (ADVIL,MOTRIN) 600 MG tablet  Every 6 hours    Question:  Supervising Provider  Answer:  Levie Heritage   08/11/17 6962   08/11/17 0000  Discharge patient    Question Answer Comment  Discharge disposition 01-Home or Self Care   Discharge patient date 08/11/2017      08/11/17 9528   08/09/17 0000  OB RESULT CONSOLE Group B Strep    Comments:  This external order was created through the Results Console.   08/09/17 1640      Diet: routine diet  Activity: Advance as tolerated. Pelvic rest for 6 weeks.   Outpatient follow up:4 weeks Follow up Appt:Future Appointments Date Time Provider Department  Center  09/08/2017 8:15 AM Orvilla Cornwall A, CNM [facility] None   Follow up Visit:No Follow-up on file.  Postpartum contraception: 73wks  Newborn Data: Live born female  Birth Weight: 8 lb 15.9 oz (4080 g) APGAR: 9, 9  Baby Feeding: Breast Disposition:home with mother   08/11/2017 Wynelle Bourgeois, CNM

## 2017-08-11 NOTE — Lactation Note (Signed)
This note was copied from a baby's chart. Lactation Consultation Note Mom states BF going well. This is mom;s 5th child she BF her others w/o difficulty. The longest she BF was her last child now 36 yrs old for 2 1/2 yrs.  Mom has large pendulum breast w/everted nipple shining through gown.  Mom states baby has been cluster feeding tonight, appears to be satisfied after feedings for a while. Encouraged to monitor I&O. Mom encouraged to feed baby 8-12 times/24 hours and with feeding cues.  Discussed engorgement prevention.  [facility] Mom shown how to use DEBP & how to disassemble, clean, & reassemble parts. Patient Name: Julia Gabriel Rainwaternnetta Sickman RUEAV'WToday's Date: 08/11/2017 Reason for consult: Initial assessment   Maternal Data Has patient been taught Hand Expression?: Yes Does the patient have breastfeeding experience prior to this delivery?: Yes  Feeding    LATCH Score       Type of Nipple: Everted at rest and after stimulation  Comfort (Breast/Nipple): Soft / non-tender  Hold (Positioning): No assistance needed to correctly position infant at breast.     Interventions Interventions: Breast feeding basics reviewed  Lactation Tools Discussed/Used WIC Program: Yes   Consult Status Consult Status: Complete Date: 08/11/17    Charyl DancerCARVER, Julia Chang 08/11/2017, 4:25 AM

## 2017-08-16 ENCOUNTER — Telehealth: Payer: Self-pay | Admitting: *Deleted

## 2017-08-16 NOTE — Telephone Encounter (Signed)
[name], home nurse went out to see pt today after delivery. Nurse reports pt to have elevated BP today, 140/94. States pt denies HA, vision changes, swelling. Pt is currently not on any BP meds, no problems withBP during pregnancy. Nurse states she can go out and repeat BP if needed.    Please advise on pt BP.  Have nurse repeat or pt need appt?

## 2017-08-17 ENCOUNTER — Other Ambulatory Visit: Payer: Self-pay | Admitting: Certified Nurse Midwife

## 2017-08-17 NOTE — Telephone Encounter (Signed)
Recheck tomorrow (if she cannot,place on nurse visit schedule), if still elevated will need appointment or MAU eval depending on her blood pressure.  Thanks; Boykin Reaperachelle

## 2017-08-17 NOTE — Telephone Encounter (Signed)
Spoke with TrafalgarShonda, home nurse, she is going to call pt and try to go out for repeat BP check. If not, pt will be placed on nurse schedule or be sent to MAU for eval.

## 2017-08-17 NOTE — Telephone Encounter (Signed)
Spoke with TatitlekShonda, home nurse, pt repeat BP today was 144/86. Pt denies HA, vision changes or swelling.   [facility] had just picked up kids when home nurse arrived. Pt states that she has a BP monitor at home and has gotten readings as low as 124/84.  Reviewed with Dr Clearance CootsHarper,  Advised to have pt come in office later this week for BP check/nurse visit.

## 2017-08-18 ENCOUNTER — Encounter: Payer: Medicaid Other | Admitting: Certified Nurse Midwife

## 2017-08-18 NOTE — Telephone Encounter (Signed)
Attempt to contact pt this morning. LM on VM advising pt that she needs to be seen in office this week for nurse visit in order to have BP check to rule out any postpartum concerns. Advised pt to contact office in order to schedule.

## 2017-08-19 ENCOUNTER — Ambulatory Visit (INDEPENDENT_AMBULATORY_CARE_PROVIDER_SITE_OTHER): Payer: Medicaid Other

## 2017-08-19 VITALS — BP 130/79 | HR 79 | Wt 198.0 lb

## 2017-08-19 DIAGNOSIS — Z0130 Encounter for examination of blood pressure without abnormal findings: Secondary | ICD-10-CM

## 2017-08-19 NOTE — Progress Notes (Signed)
10 days PP. Presents for BP Check, have headache 7/10, no dizziness, swelling of ankles or auroras.  Subjective:  Julia Chang is a 36 y.o. female with hypertension. Current Outpatient Prescriptions  Medication Sig Dispense Refill  . Elastic Bandages & Supports (COMFORT FIT MATERNITY SUPP LG) MISC 1 Units by Does not apply route daily. 1 each 0  . ibuprofen (ADVIL,MOTRIN) 600 MG tablet Take 1 tablet (600 mg total) by mouth every 6 (six) hours. 30 tablet 0  . Prenat-FeAsp-Meth-FA-DHA w/o A (PRENATE PIXIE) 10-0.6-0.4-200 MG CAPS Take 1 tablet by mouth daily. 30 capsule 12  . valACYclovir (VALTREX) 1000 MG tablet Take 1 tablet (1,000 mg total) by mouth daily. 30 tablet 3  . Vitamin D, Ergocalciferol, (DRISDOL) 50000 units CAPS capsule Take 1 capsule (50,000 Units total) by mouth every 7 (seven) days. (Patient taking differently: Take 50,000 Units by mouth every 7 (seven) days. thursdays) 30 capsule 2   No current facility-administered medications for this visit.     Hypertension ROS: taking medications as instructed, no medication side effects noted, home BP monitoring in range of 130's systolic over 86's diastolic, no TIA's, no chest pain on exertion, no dyspnea on exertion and no swelling of ankles.  New concerns: none.   Objective:  BP 130/79   Pulse 79   Wt 198 lb (89.8 kg)   LMP 11/12/2016   Breastfeeding? Yes   BMI 31.96 kg/m   Appearance alert, well appearing, and in no distress. General exam BP noted to be well controlled today in office.    Assessment:   Hypertension well controlled.   Plan:  Current treatment plan is effective, no change in therapy..Marland Kitchen

## 2017-08-19 NOTE — Progress Notes (Signed)
Agree with nursing staff's documentation of this patient's clinic encounter. Advised admits to not being able to rest and eat well. Advised to stay well hydrated and to take tylenol for headache.   Catalina AntiguaPeggy Alandra Sando, MD

## 2017-09-08 ENCOUNTER — Encounter: Payer: Self-pay | Admitting: Certified Nurse Midwife

## 2017-09-08 ENCOUNTER — Encounter: Payer: Self-pay | Admitting: *Deleted

## 2017-09-08 ENCOUNTER — Ambulatory Visit (INDEPENDENT_AMBULATORY_CARE_PROVIDER_SITE_OTHER): Payer: Medicaid Other | Admitting: Certified Nurse Midwife

## 2017-09-08 VITALS — BP 135/87 | HR 69 | Ht 67.0 in | Wt 198.2 lb

## 2017-09-08 DIAGNOSIS — Z3009 Encounter for other general counseling and advice on contraception: Secondary | ICD-10-CM | POA: Insufficient documentation

## 2017-09-08 DIAGNOSIS — Z30011 Encounter for initial prescription of contraceptive pills: Secondary | ICD-10-CM

## 2017-09-08 MED ORDER — NORETHINDRONE 0.35 MG PO TABS
1.0000 | ORAL_TABLET | Freq: Every day | ORAL | 11 refills | Status: DC
Start: 2017-09-08 — End: 2017-09-16

## 2017-09-08 NOTE — Progress Notes (Signed)
Post Partum Exam  Julia Chang is a 36 y.o. [id number] female who presents for a postpartum visit. She is 4 weeks postpartum following a spontaneous vaginal delivery. I have fully reviewed the prenatal and intrapartum course. The delivery was at 38 gestational weeks.  Anesthesia: epidural. Postpartum course has been good. Baby's course has been good. Baby is feeding by breast. Bleeding no bleeding. Bowel function is normal. Bladder function is normal. Patient is not sexually active. Contraception method is tubal ligation. Postpartum depression screening:neg.  Question about blood pressures postpartum.  Normotensive today.    The following portions of the patient's history were reviewed and updated as appropriate: allergies, current medications, past family history, past medical history, past social history, past surgical history and problem list.  Review of Systems Pertinent items noted in HPI and remainder of comprehensive ROS otherwise negative.    Objective:  Blood pressure 135/87, pulse 69, height  (1.702 m), weight 198 lb 3.2 oz (89.9 kg), currently breastfeeding.  General:  alert, cooperative and no distress   Breasts:  inspection negative, no nipple discharge or bleeding, no masses or nodularity palpable  Lungs: clear to auscultation bilaterally  Heart:  regular rate and rhythm, S1, S2 normal, no murmur, click, rub or gallop  Abdomen: soft, non-tender; bowel sounds normal; no masses,  no organomegaly, obese  Pelvic Exam: Not performed.       02/02/17: normal pap smear  Assessment:    Normal 4 week postpartum exam. Pap smear not done at today's visit.   Obese  Interval BTL planned  Start on POP d/t breast feeding status, encouraged abstinence until BTL can be performed.   Plan:   1. Contraception: abstinence and oral progesterone-only contraceptive 2. Return to work note completed.   Message sent to scheduling about interval BTL.  3. Follow up in: 5 months  For annual exam  or as needed.

## 2017-09-13 ENCOUNTER — Ambulatory Visit: Payer: Medicaid Other | Admitting: Obstetrics and Gynecology

## 2017-09-16 ENCOUNTER — Ambulatory Visit (INDEPENDENT_AMBULATORY_CARE_PROVIDER_SITE_OTHER): Payer: Medicaid Other | Admitting: Obstetrics and Gynecology

## 2017-09-16 ENCOUNTER — Encounter: Payer: Self-pay | Admitting: Obstetrics and Gynecology

## 2017-09-16 VITALS — BP 147/94 | HR 89 | Wt 200.0 lb

## 2017-09-16 DIAGNOSIS — R030 Elevated blood-pressure reading, without diagnosis of hypertension: Secondary | ICD-10-CM

## 2017-09-16 DIAGNOSIS — Z3009 Encounter for other general counseling and advice on contraception: Secondary | ICD-10-CM | POA: Diagnosis not present

## 2017-09-16 DIAGNOSIS — Z308 Encounter for other contraceptive management: Secondary | ICD-10-CM | POA: Insufficient documentation

## 2017-09-16 NOTE — Patient Instructions (Signed)
Laparoscopic Tubal Ligation Laparoscopic tubal ligation is a procedure to close the fallopian tubes. This is done so that you cannot get pregnant. When the fallopian tubes are closed, the eggs that your ovaries release cannot enter the uterus, and sperm cannot reach the released eggs. A laparoscopic tubal ligation is sometimes called "getting your tubes tied." You should not have this procedure if you want to get pregnant someday or if you are unsure about having more children. Tell a health care provider about:  Any allergies you have.  All medicines you are taking, including vitamins, herbs, eye drops, creams, and over-the-counter medicines.  Any problems you or family members have had with anesthetic medicines.  Any blood disorders you have.  Any surgeries you have had.  Any medical conditions you have.  Whether you are pregnant or may be pregnant.  Any past pregnancies. What are the risks? Generally, this is a safe procedure. However, problems may occur, including:  Infection.  Bleeding.  Injury to surrounding organs.  Side effects from anesthetics.  Failure of the procedure.  This procedure can increase your risk of a kind of pregnancy in which a fertilized egg attaches to the outside of the uterus (ectopic pregnancy). What happens before the procedure?  Ask your health care provider about: ? Changing or stopping your regular medicines. This is especially important if you are taking diabetes medicines or blood thinners. ? Taking medicines such as aspirin and ibuprofen. These medicines can thin your blood. Do not take these medicines before your procedure if your health care provider instructs you not to.  Follow instructions from your health care provider about eating and drinking restrictions.  Plan to have someone take you home after the procedure.  If you go home right after the procedure, plan to have someone with you for 24 hours. What happens during the  procedure?  You will be given one or more of the following: ? A medicine to help you relax (sedative). ? A medicine to numb the area (local anesthetic). ? A medicine to make you fall asleep (general anesthetic). ? A medicine that is injected into an area of your body to numb everything below the injection site (regional anesthetic).  An IV tube will be inserted into one of your veins. It will be used to give you medicines and fluids during the procedure.  Your bladder may be emptied with a small tube (catheter).  If you have been given a general anesthetic, a tube will be put down your throat to help you breathe.  Two small cuts (incisions) will be made in your lower abdomen and near your belly button.  Your abdomen will be inflated with a gas. This will let the surgeon see better and will give the surgeon room to work.  A thin, lighted tube (laparoscope) with a camera attached will be inserted into your abdomen through one of the incisions. Small instruments will be inserted through the other incision.  The fallopian tubes will be tied off, burned (cauterized), or blocked with a clip, ring, or clamp. A small portion in the center of each fallopian tube may be removed.  The gas will be released from the abdomen.  The incisions will be closed with stitches (sutures).  A bandage (dressing) will be placed over the incisions. The procedure may vary among health care providers and hospitals. What happens after the procedure?  Your blood pressure, heart rate, breathing rate, and blood oxygen level will be monitored often until the medicines you   were given have worn off.  You will be given medicine to help with pain, nausea, and vomiting as needed. This information is not intended to replace advice given to you by your health care provider. Make sure you discuss any questions you have with your health care provider. Document Released: 03/01/2001 Document Revised: 04/30/2016 Document  Reviewed: 11/03/2015 Elsevier Interactive Patient Education  2018 [employer]  

## 2017-09-16 NOTE — Progress Notes (Signed)
Pt here for BTL consult. BTL papers signed 07/29/17  PE AF BP 148/96  147/94  Lungs clear Heart RRR Abd soft + BS  A/P Unwanted fertility        Elevated BP  Pt to return in 1 week for BP check Lap BTL reviewed R/B/Failure and post op care discussed. Pt advised to reframe from IC until BTL completed Will schedule with first available provider F/U post op

## 2017-09-17 ENCOUNTER — Encounter (HOSPITAL_COMMUNITY): Payer: Self-pay

## 2017-09-22 ENCOUNTER — Ambulatory Visit (INDEPENDENT_AMBULATORY_CARE_PROVIDER_SITE_OTHER): Payer: Medicaid Other

## 2017-09-22 VITALS — BP 122/72

## 2017-09-22 DIAGNOSIS — Z0130 Encounter for examination of blood pressure without abnormal findings: Secondary | ICD-10-CM

## 2017-09-22 NOTE — Progress Notes (Signed)
I have reviewed this chart and agree with the RN assessment and management.    K. Meryl Tomeca Helm, M.D. Attending Obstetrician & Gynecologist, [facility], [facility]  

## 2017-09-22 NOTE — Progress Notes (Signed)
Subjective:  Julia Chang is a 36 y.o. female presents for Blood Pressure check.  No current outpatient prescriptions on file.   No current facility-administered medications for this visit.     Hypertension ROS: no TIA's, no chest pain on exertion, no dyspnea on exertion and no swelling of ankles.  New concerns: None per pt.   Objective:  BP 122/72   Appearance alert, well appearing, and in no distress. General exam BP noted to be well controlled today in office.    Assessment:   Hypertension well controlled.   Plan:  BP stable Patient may proceed with BTL surgery scheduled 10/06/17.

## 2017-10-05 ENCOUNTER — Encounter (HOSPITAL_COMMUNITY): Payer: Self-pay

## 2017-10-06 ENCOUNTER — Ambulatory Visit (HOSPITAL_BASED_OUTPATIENT_CLINIC_OR_DEPARTMENT_OTHER)
Admission: RE | Admit: 2017-10-06 | Payer: Medicaid Other | Source: Ambulatory Visit | Admitting: Obstetrics and Gynecology

## 2017-10-06 ENCOUNTER — Encounter (HOSPITAL_BASED_OUTPATIENT_CLINIC_OR_DEPARTMENT_OTHER): Admission: RE | Payer: Self-pay | Source: Ambulatory Visit

## 2017-10-06 SURGERY — LIGATION, FALLOPIAN TUBE, LAPAROSCOPIC
Anesthesia: Choice | Laterality: Bilateral

## 2017-11-03 ENCOUNTER — Encounter (HOSPITAL_BASED_OUTPATIENT_CLINIC_OR_DEPARTMENT_OTHER): Admission: RE | Payer: Self-pay | Source: Ambulatory Visit

## 2017-11-03 ENCOUNTER — Ambulatory Visit (HOSPITAL_BASED_OUTPATIENT_CLINIC_OR_DEPARTMENT_OTHER)
Admission: RE | Admit: 2017-11-03 | Payer: Medicaid Other | Source: Ambulatory Visit | Admitting: Obstetrics and Gynecology

## 2017-11-03 SURGERY — LIGATION, FALLOPIAN TUBE, LAPAROSCOPIC
Anesthesia: Choice | Laterality: Bilateral

## 2017-11-23 ENCOUNTER — Telehealth: Payer: Self-pay | Admitting: Pediatrics

## 2017-11-23 ENCOUNTER — Other Ambulatory Visit: Payer: Self-pay | Admitting: Pediatrics

## 2017-11-23 NOTE — Telephone Encounter (Signed)
Prenate Pixie not covered-change or PA?

## 2017-11-23 NOTE — Telephone Encounter (Signed)
[employer] will not cover Prenate Pixie.  Would you like to change rx or have pt take OTC?

## 2017-11-24 ENCOUNTER — Other Ambulatory Visit: Payer: Self-pay | Admitting: Certified Nurse Midwife

## 2017-11-24 NOTE — Telephone Encounter (Signed)
Medicaid does cover prenate pixie.  Please call the pharmacy to find out why they are not filling this.  Thank you. Julia Chang

## 2017-11-24 NOTE — Telephone Encounter (Signed)
I called medicaid.  This patient has [employer].  They will not cover Prenate Pixie.

## 2017-11-25 NOTE — Telephone Encounter (Signed)
Will need to take OTC prenatals until her medicaid is changed.  Thank you. Julia Chang

## 2017-11-25 NOTE — Telephone Encounter (Signed)
I left message on identifying voicemail advising providers recommendation and to call back with questions or concerns.

## 2018-11-06 IMAGING — US US MFM OB FOLLOW-UP
1 series · 14 of 28 positions shown · non-contrast
Comparison: none

[Series 1: us mfm ob follow-up · 50 acquisitions, 14 frames shown]
[im 2/50]
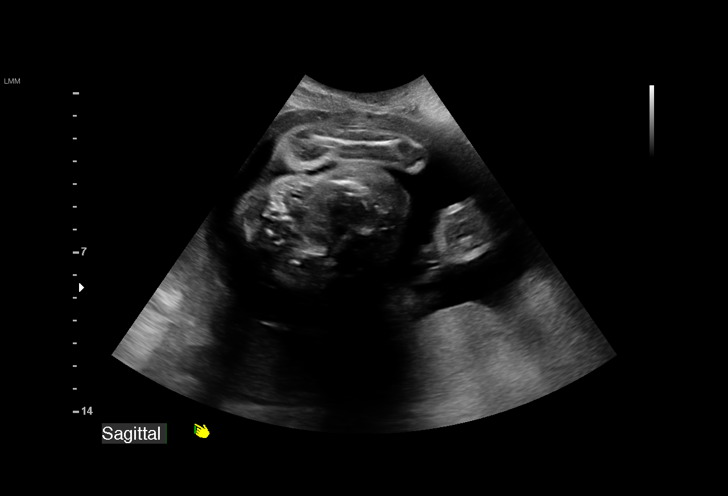
[im 6/50]
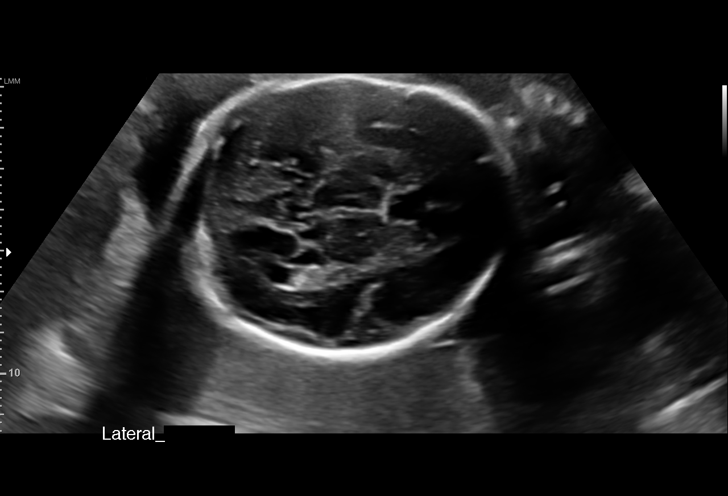
[im 10/50]
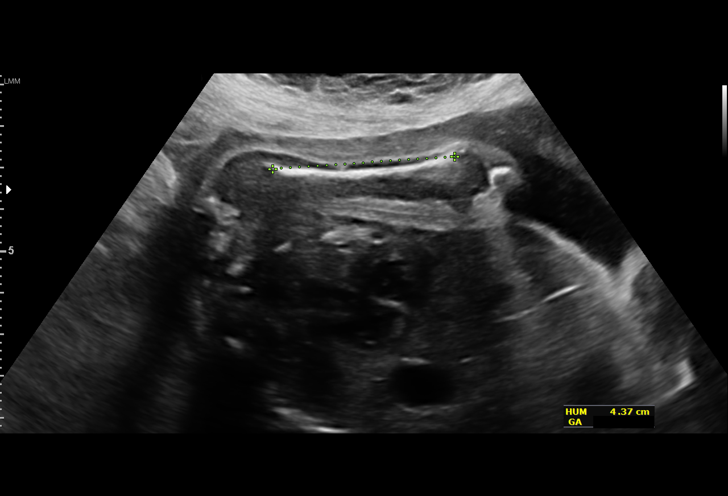
[im 13/50]
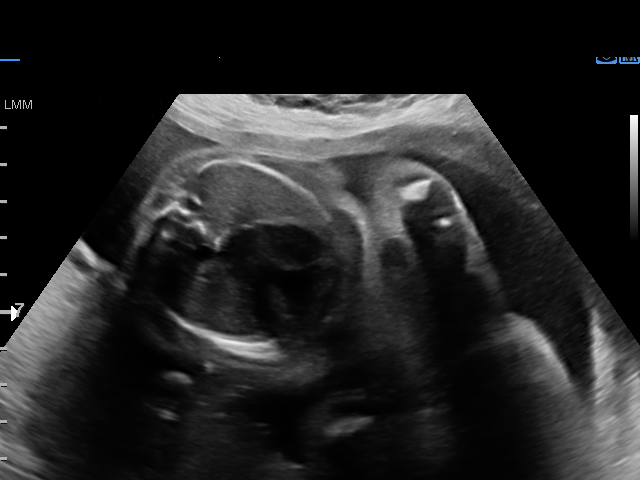
[im 17/50]
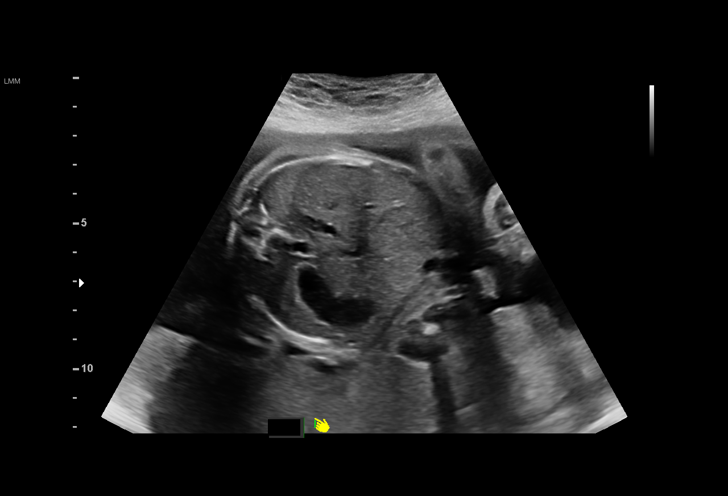
[im 20/50]
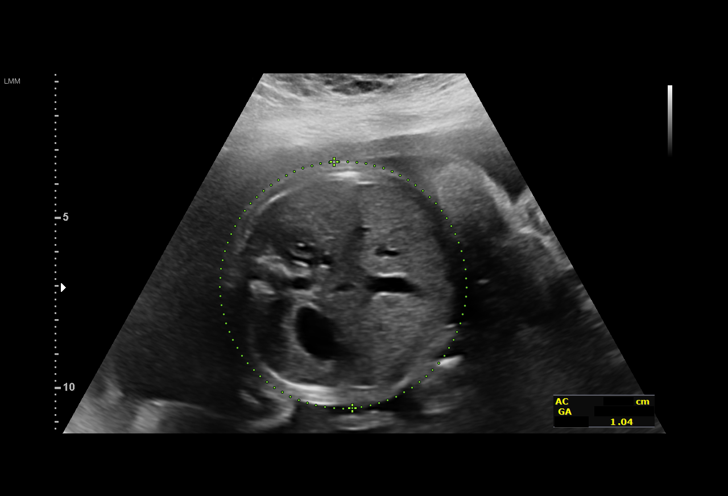
[im 24/50]
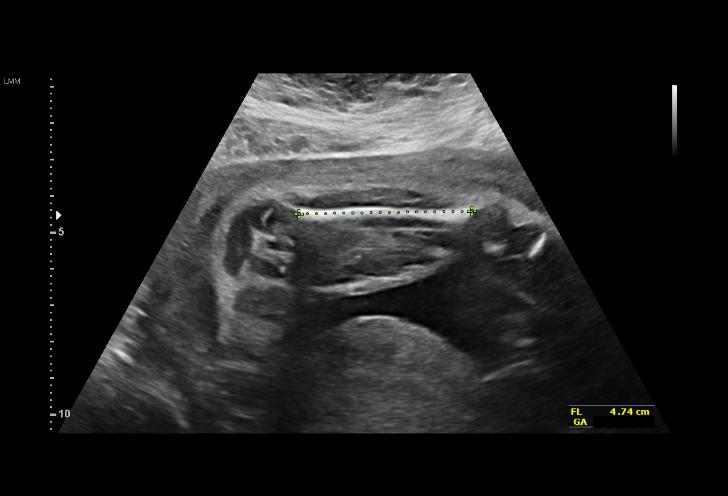
[im 28/50]
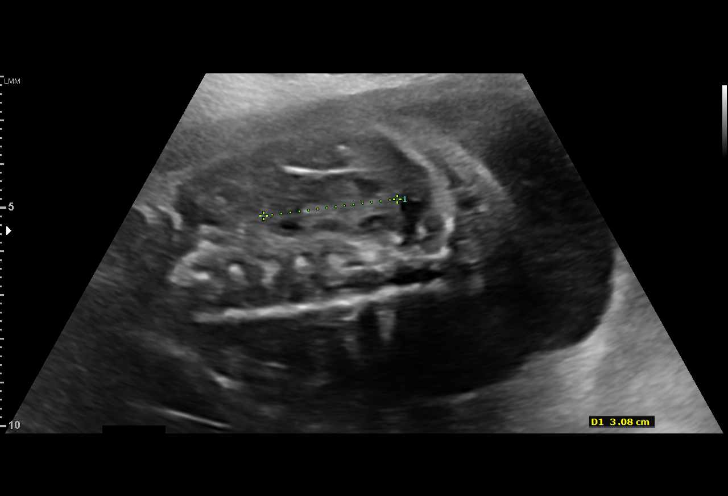
[im 31/50]
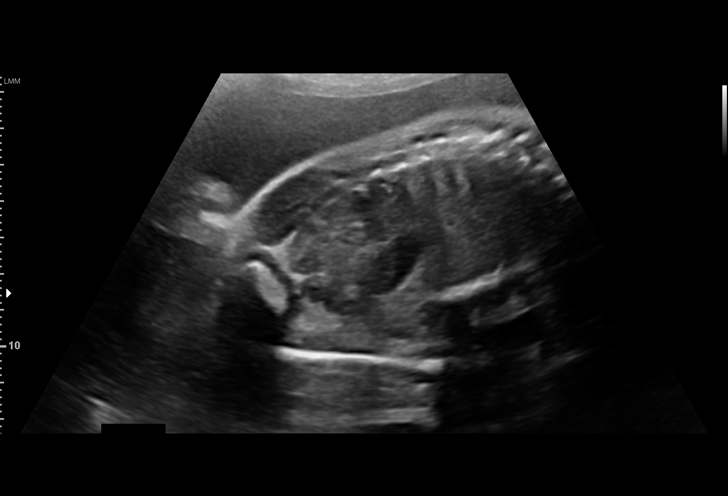
[im 35/50]
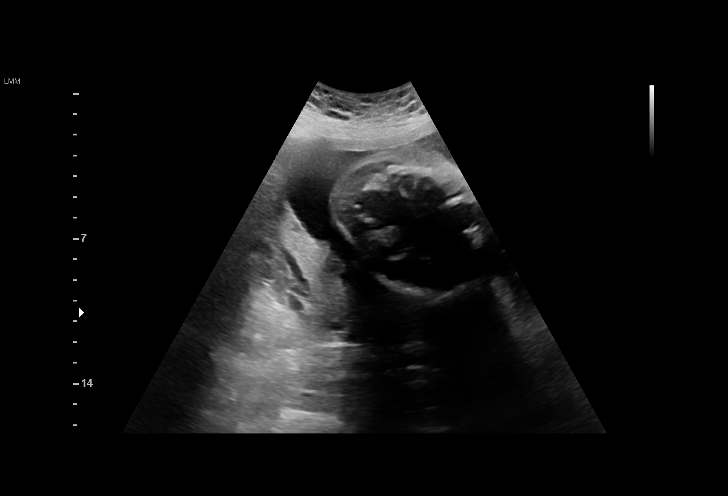
[im 39/50]
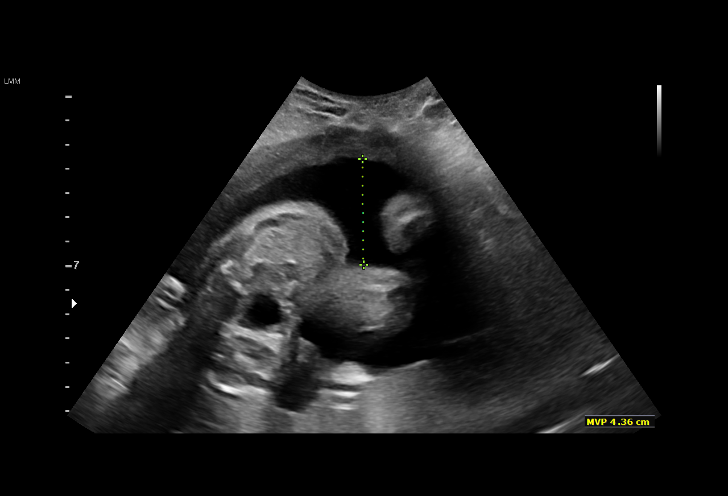
[im 42/50]
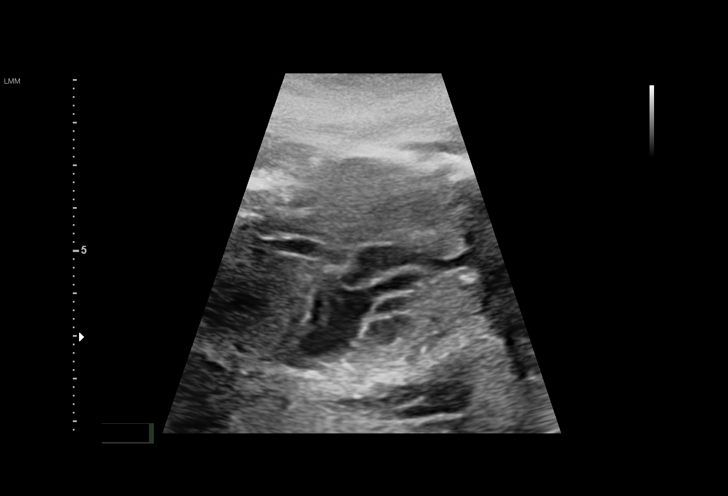
[im 46/50]
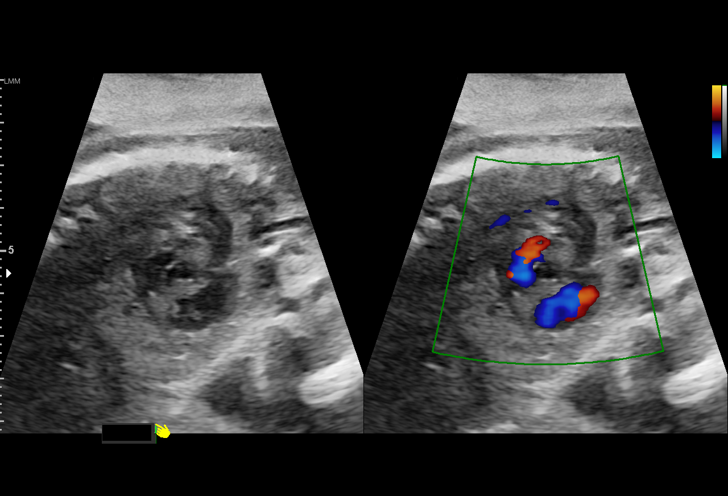
[im 50/50]
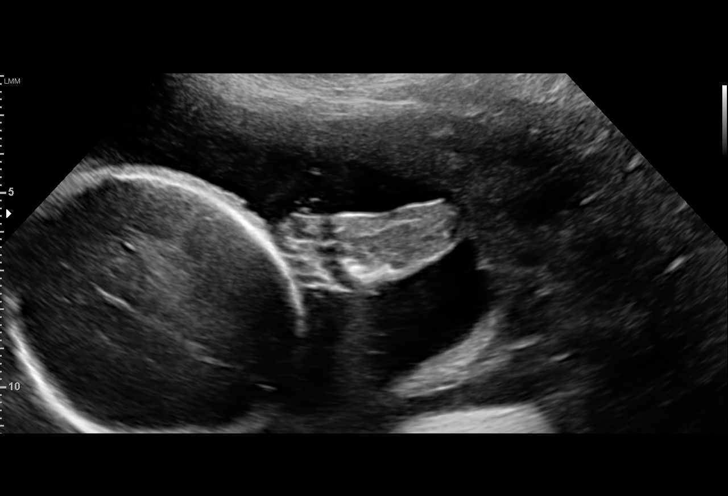

[14 of 28 positions shown; findings below may reference images not displayed]

Road [HOSPITAL]

Indications

25 weeks gestation of pregnancy
Advanced maternal age multigravida 35+,
second trimester (low risk NIPS)
Obesity complicating pregnancy, second
trimester
Antenatal follow-up for nonvisualized fetal
anatomy
OB History

Blood Type:            Height:  5'7"   Weight (lb):  207       BMI:
Gravidity:    5         Term:   4
Living:       4
Fetal Evaluation

Num Of Fetuses:     1
Fetal Heart         157
Rate(bpm):
Cardiac Activity:   Observed
Presentation:       Cephalic
Placenta:           Posterior, above cervical os
P. Cord Insertion:  Previously Visualized

Amniotic Fluid
AFI FV:      Subjectively within normal limits

Largest Pocket(cm)
4.36
Biometry
BPD:      66.2  mm     G. Age:  26w 5d         89  %    CI:        75.28   %    70 - 86
FL/HC:      19.6   %    18.7 -
HC:       242   mm     G. Age:  26w 2d         69  %    HC/AC:      1.06        1.04 -
AC:      229.3  mm     G. Age:  27w 2d         93  %    FL/BPD:     71.8   %    71 - 87
FL:       47.5  mm     G. Age:  25w 6d         59  %    FL/AC:      20.7   %    20 - 24
HUM:      43.9  mm     G. Age:  26w 0d         68  %

Est. FW:     967  gm      2 lb 2 oz     78  %
Gestational Age

LMP:           25w 1d        Date:  11/12/16                 EDD:   08/19/17
U/S Today:     26w 4d                                        EDD:   08/09/17
Best:          25w 1d     Det. By:  LMP  (11/12/16)          EDD:   08/19/17
Anatomy

Cranium:               Appears normal         Aortic Arch:            Appears normal
Cavum:                 Appears normal         Ductal Arch:            Appears normal
Ventricles:            Appears normal         Diaphragm:              Previously seen
Choroid Plexus:        Appears normal         Stomach:                Appears normal, left
sided
Cerebellum:            Appears normal         Abdomen:                Appears normal
Posterior Fossa:       Previously seen        Abdominal Wall:         Previously seen
Nuchal Fold:           Previously seen        Cord Vessels:           Previously seen
Face:                  Profile appears        Kidneys:                Appear normal
normal
Lips:                  Previously seen        Bladder:                Appears normal
Thoracic:              Appears normal         Spine:                  Previously seen
Heart:                 Appears normal         Upper Extremities:      Previously seen
(4CH, axis, and situs
RVOT:                  Appears normal         Lower Extremities:      Previously seen
LVOT:                  Appears normal

Other:  Technically difficult due to maternal habitus and fetal position.
Cervix Uterus Adnexa

Cervix
Length:           4.74  cm.
Normal appearance by transabdominal scan.

Uterus
No abnormality visualized.

Left Ovary
Not visualized.

Right Ovary
Not visualized.

Cul De Sac:   No free fluid seen.
Impression

Singleton intrauterine pregnancy at 25+1 weeks, here for
anatomic survey
Review of the anatomy shows no sonographic markers for
aneuploidy or structural anomalies.
The previous mild pyelectasis is not present
all relevant anatomy has been seen
Amniotic fluid volume is normal
Estimated fetal weight is 967g which is growth in the 78th
percentile
Recommendations

Follow-up ultrasounds as clinically indicated.

## 2019-03-06 ENCOUNTER — Ambulatory Visit (HOSPITAL_COMMUNITY)
Admission: EM | Admit: 2019-03-06 | Discharge: 2019-03-06 | Disposition: A | Discharge status: Home / Self Care | Payer: Medicaid Other | Attending: Family Medicine | Admitting: Family Medicine

## 2019-03-06 ENCOUNTER — Other Ambulatory Visit: Payer: Self-pay

## 2019-03-06 ENCOUNTER — Encounter (HOSPITAL_COMMUNITY): Payer: Self-pay | Admitting: Emergency Medicine

## 2019-03-06 DIAGNOSIS — R519 Headache, unspecified: Secondary | ICD-10-CM

## 2019-03-06 DIAGNOSIS — R51 Headache: Secondary | ICD-10-CM

## 2019-03-06 DIAGNOSIS — R059 Cough, unspecified: Secondary | ICD-10-CM

## 2019-03-06 DIAGNOSIS — R05 Cough: Secondary | ICD-10-CM

## 2019-03-06 MED ORDER — DIPHENHYDRAMINE HCL 25 MG PO TABS: 25.0000 mg | ORAL_TABLET | Freq: Four times a day (QID) | ORAL | 0 refills | Status: DC

## 2019-03-06 MED ORDER — IBUPROFEN 800 MG PO TABS: 800.0000 mg | ORAL_TABLET | Freq: Three times a day (TID) | ORAL | 0 refills | Status: DC

## 2019-03-06 MED ORDER — CETIRIZINE HCL 10 MG PO TABS: 10.0000 mg | ORAL_TABLET | Freq: Every day | ORAL | 0 refills | Status: DC

## 2019-03-06 NOTE — ED Provider Notes (Signed)
[facility]    CSN: [id number] Arrival date & time: 03/06/19  1221     History   Chief Complaint Chief Complaint  Patient presents with  . URI    appt 1230  . Generalized Body Aches    HPI Julia Chang is a 38 y.o. female.   Julia Chang presents with complaints of headache and body aches which started two days ago. Non fevers. Has had some cough which has improved. Took OTC cough medication which helped, but it didn't help headache. Cough is non productive. No nasal drainage, sore throat, ear pain. No gi/gu complaints. Works at a daycare. No specific known ill contacts. Has been taking tylenol which minimally helps, last at 130 this am. No dizziness. No recent travel, leg pain or swelling. Headache 6/10. Denies previous headache but on chart review hx of headache.     ROS per HPI, negative if not otherwise mentioned.      Past Medical History:  Diagnosis Date  . 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 Encounter for other contraceptive management 09/16/2017  . Elevated blood pressure reading 09/16/2017  . Unwanted fertility 09/08/2017  . Low vitamin D level 02/10/2017  . Chronic tension-type headache, not intractable 02/02/2017  . HSV-2 infection     Past Surgical History:  Procedure Laterality Date  . NO PAST SURGERIES      OB History    Gravida  5   Para  5   Term  5   Preterm      AB      Living  5     SAB      TAB      Ectopic      Multiple  0   Live Births  5            Home Medications    Prior to Admission medications   Medication Sig Start Date End Date Taking? Authorizing Provider  cetirizine (ZYRTEC) 10 MG tablet Take 1 tablet (10 mg total) by mouth daily. 03/06/19   Georgetta Haber, NP  diphenhydrAMINE (BENADRYL) 25 MG tablet Take 1 tablet (25 mg total) by mouth every 6 (six) hours. 03/06/19   Georgetta Haber, NP  ibuprofen (ADVIL,MOTRIN) 800  MG tablet Take 1 tablet (800 mg total) by mouth 3 (three) times daily. 03/06/19   Georgetta Haber, NP  Prenat-FeAsp-Meth-FA-DHA w/o A (PRENATE PIXIE) 10-0.6-0.4-200 MG CAPS Take 1 capsule by mouth daily. 08/17/17   [provider]    Family History Family History  Problem Relation Age of Onset  . Diabetes Mother   . Anesthesia problems Neg Hx   . Hypotension Neg Hx   . Malignant hyperthermia Neg Hx   . Pseudochol deficiency Neg Hx   . Other Neg Hx     Social History Social History   Tobacco Use  . Smoking status: Never Smoker  . Smokeless tobacco: Never Used  Substance Use Topics  . Alcohol use: No  . Drug use: No     Allergies   Patient has no known allergies.   Review of Systems Review of Systems   Physical Exam Triage Vital Signs ED Triage Vitals [03/06/19 1249]  Enc Vitals Group     BP 130/79     Pulse Rate 87     Resp 18     Temp 98.6 F (37 C)  Temp Source Temporal     SpO2 100 %     Weight      Height      Head Circumference      Peak Flow      Pain Score 6     Pain Loc      Pain Edu?      Excl. in GC?    No data found.  Updated Vital Signs BP 130/79 (BP Location: Right Arm)   Pulse 87   Temp 98.6 F (37 C) (Temporal)   Resp 18   SpO2 100%   Visual Acuity Right Eye Distance:   Left Eye Distance:   Bilateral Distance:    Right Eye Near:   Left Eye Near:    Bilateral Near:     Physical Exam Constitutional:      General: She is not in acute distress.    Appearance: She is well-developed.  HENT:     Head: Normocephalic and atraumatic.     Right Ear: Tympanic membrane, ear canal and external ear normal.     Left Ear: Tympanic membrane, ear canal and external ear normal.     Nose: Nose normal.     Mouth/Throat:     Pharynx: Uvula midline.     Tonsils: No tonsillar exudate.  Eyes:     Conjunctiva/sclera: Conjunctivae normal.     Pupils: Pupils are equal, round, and reactive to light.  Cardiovascular:     Rate and  Rhythm: Normal rate and regular rhythm.     Heart sounds: Normal heart sounds.  Pulmonary:     Effort: Pulmonary effort is normal.     Breath sounds: Normal breath sounds.  Skin:    General: Skin is warm and dry.  Neurological:     General: No focal deficit present.     Mental Status: She is alert. Mental status is at baseline. She is disoriented.     Cranial Nerves: No cranial nerve deficit.      UC Treatments / Results  Labs (all labs ordered are listed, but only abnormal results are displayed) Labs Reviewed - No data to display  EKG None  Radiology No results found.  Procedures Procedures (including critical care time)  Medications Ordered in UC Medications - No data to display  Initial Impression / Assessment and Plan / UC Course  I have reviewed the triage vital signs and the nursing notes.  Pertinent labs & imaging results that were available during my care of the patient were reviewed by me and considered in my medical decision making (see chart for details).     Non toxic. Benign physical exam.  Headache treatment discussed. New onset of cough with body aches and headache. Works in child care. With Covid-19 in community still recommended self quarantine for 7 days. Return precautions provided. Patient verbalized understanding and agreeable to plan.    Final Clinical Impressions(s) / UC Diagnoses   Final diagnoses:  Acute nonintractable headache, unspecified headache type  Cough     Discharge Instructions     Take an ibuprofen and benadryl today, try to sleep in dark quiet room to help with headache.  Tomorrow start daily zyrtec.  Tylenol as needed.  Push fluids to ensure adequate hydration and keep secretions thin.   Due to new onset cough, body aches and environment you work in I would recommend self quarantine for 7 days from onset of symptoms.     ED Prescriptions    Medication Sig Dispense Auth.  Provider   ibuprofen (ADVIL,MOTRIN) 800 MG tablet  Take 1 tablet (800 mg total) by mouth 3 (three) times daily. 21 tablet Linus Mako B, NP   cetirizine (ZYRTEC) 10 MG tablet Take 1 tablet (10 mg total) by mouth daily. 30 tablet Linus Mako B, NP   diphenhydrAMINE (BENADRYL) 25 MG tablet Take 1 tablet (25 mg total) by mouth every 6 (six) hours. 20 tablet Georgetta Haber, NP     Controlled Substance Prescriptions [place] Controlled Substance Registry consulted? Not Applicable   Georgetta Haber, NP 03/06/19 1359

## 2019-03-06 NOTE — Discharge Instructions (Signed)
Take an ibuprofen and benadryl today, try to sleep in dark quiet room to help with headache.  Tomorrow start daily zyrtec.  Tylenol as needed.  Push fluids to ensure adequate hydration and keep secretions thin.   Due to new onset cough, body aches and environment you work in I would recommend self quarantine for 7 days from onset of symptoms.

## 2019-03-06 NOTE — ED Notes (Signed)
Bed: UC09 Expected date:  Expected time:  Means of arrival:  Comments: APPT Uncapher, A.

## 2019-03-06 NOTE — ED Triage Notes (Signed)
Pt here for URI sx with HA and body aches x 3 days

## 2020-03-06 ENCOUNTER — Ambulatory Visit
Admission: RE | Admit: 2020-03-06 | Discharge: 2020-03-06 | Disposition: A | Discharge status: Home / Self Care | Payer: Medicaid Other | Source: Ambulatory Visit | Attending: Family Medicine | Admitting: Family Medicine

## 2020-03-06 ENCOUNTER — Other Ambulatory Visit: Payer: Self-pay | Admitting: Family Medicine

## 2020-03-06 DIAGNOSIS — M545 Low back pain, unspecified: Secondary | ICD-10-CM

## 2021-09-05 IMAGING — DX DG LUMBAR SPINE COMPLETE 4+V
5 series · 5 of 5 positions shown · non-contrast
Comparison: None.

CLINICAL DATA: Low back pain.

EXAM:
LUMBAR SPINE - COMPLETE 4+ VIEW

[dg lumbar spine complete 4 +v (1 of 5)]
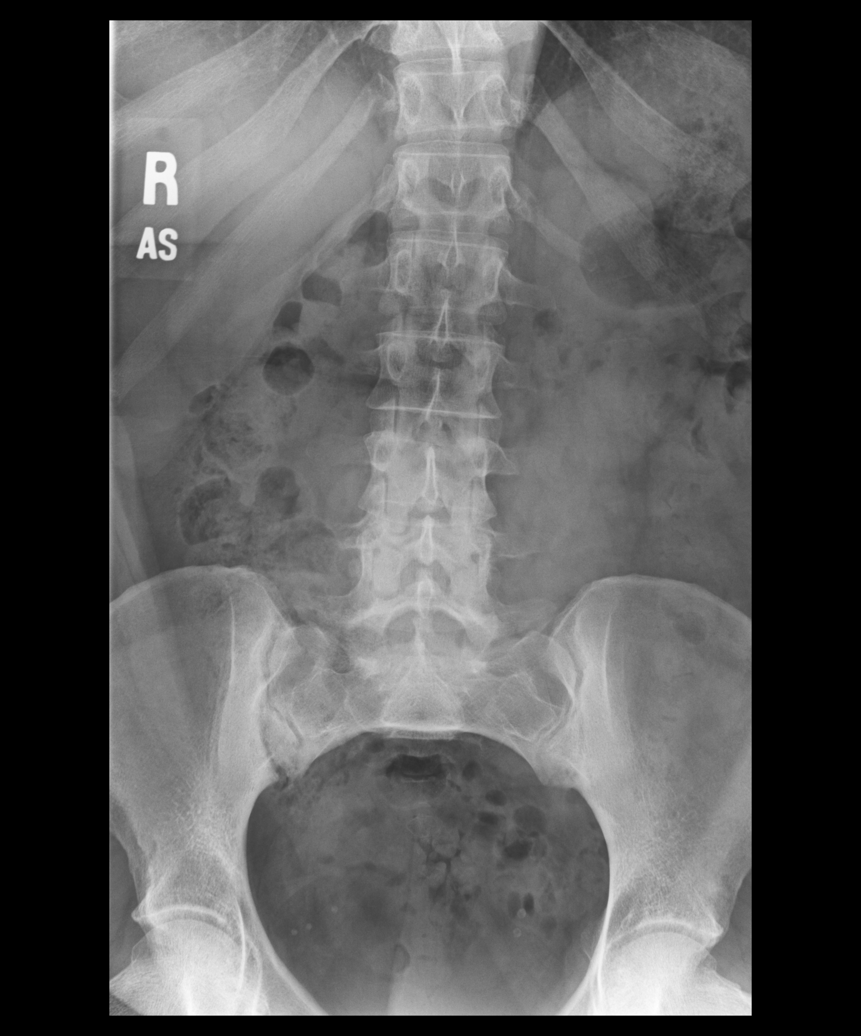

[dg lumbar spine complete 4 +v (2 of 5)]
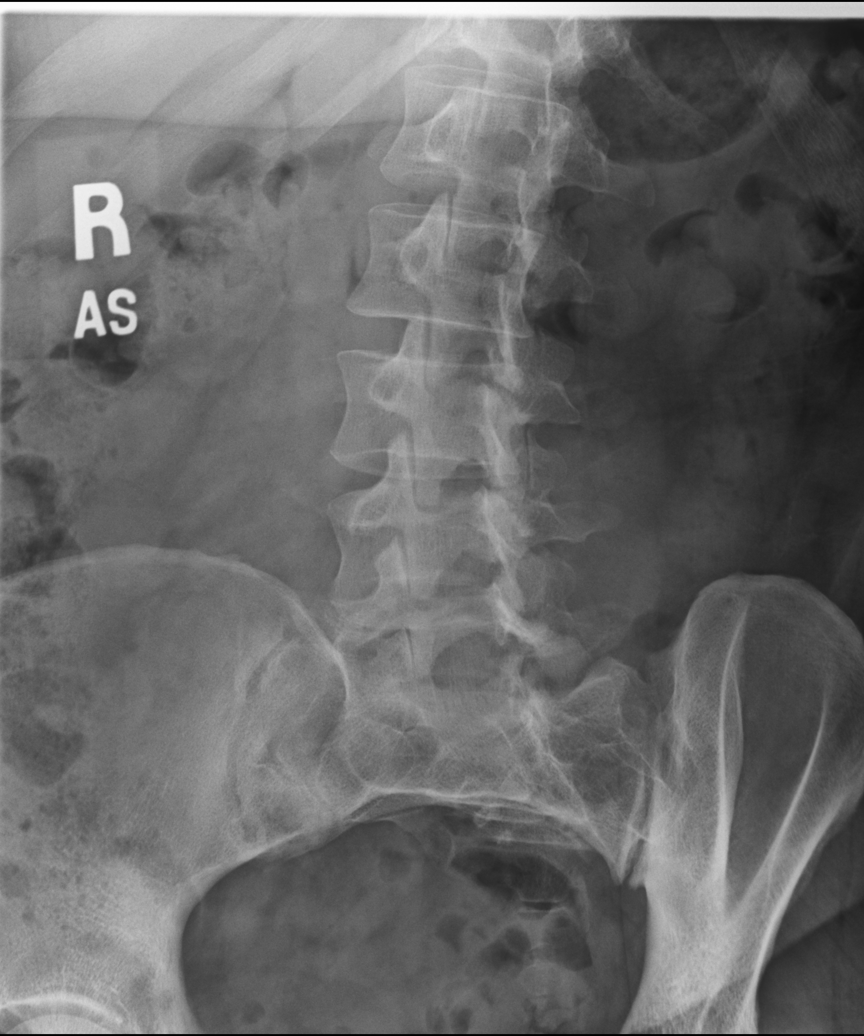

[dg lumbar spine complete 4 +v (3 of 5)]
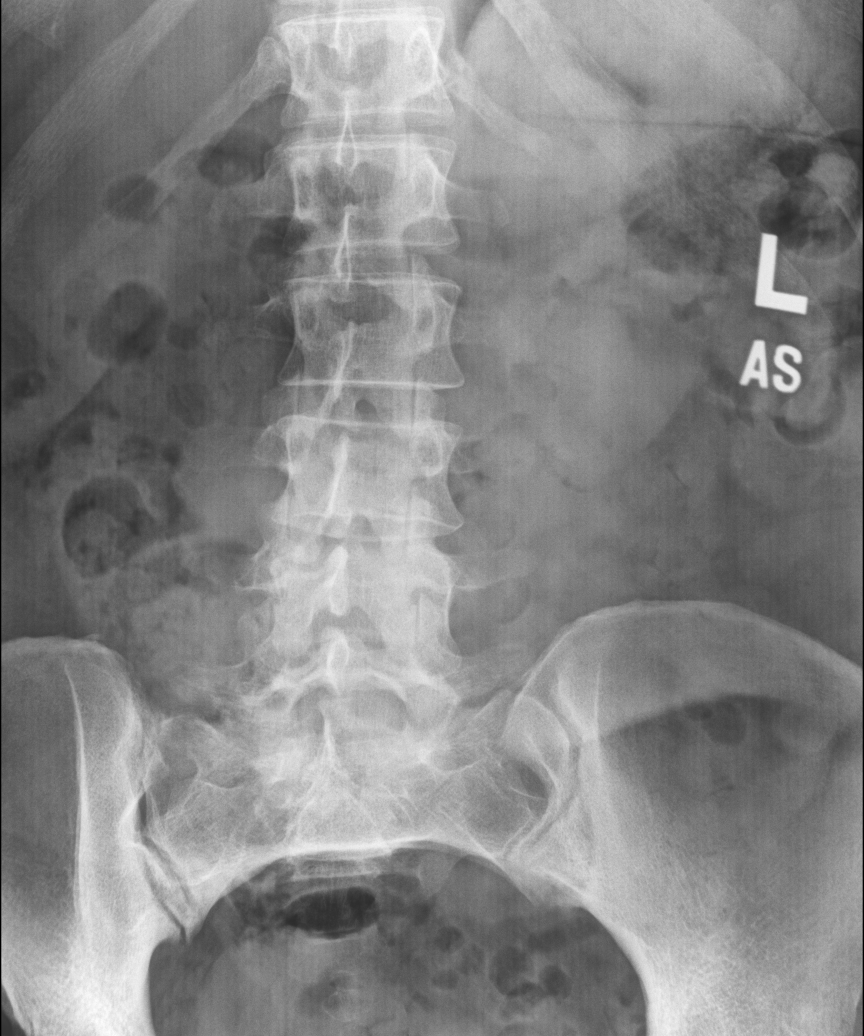

[dg lumbar spine complete 4 +v (4 of 5)]
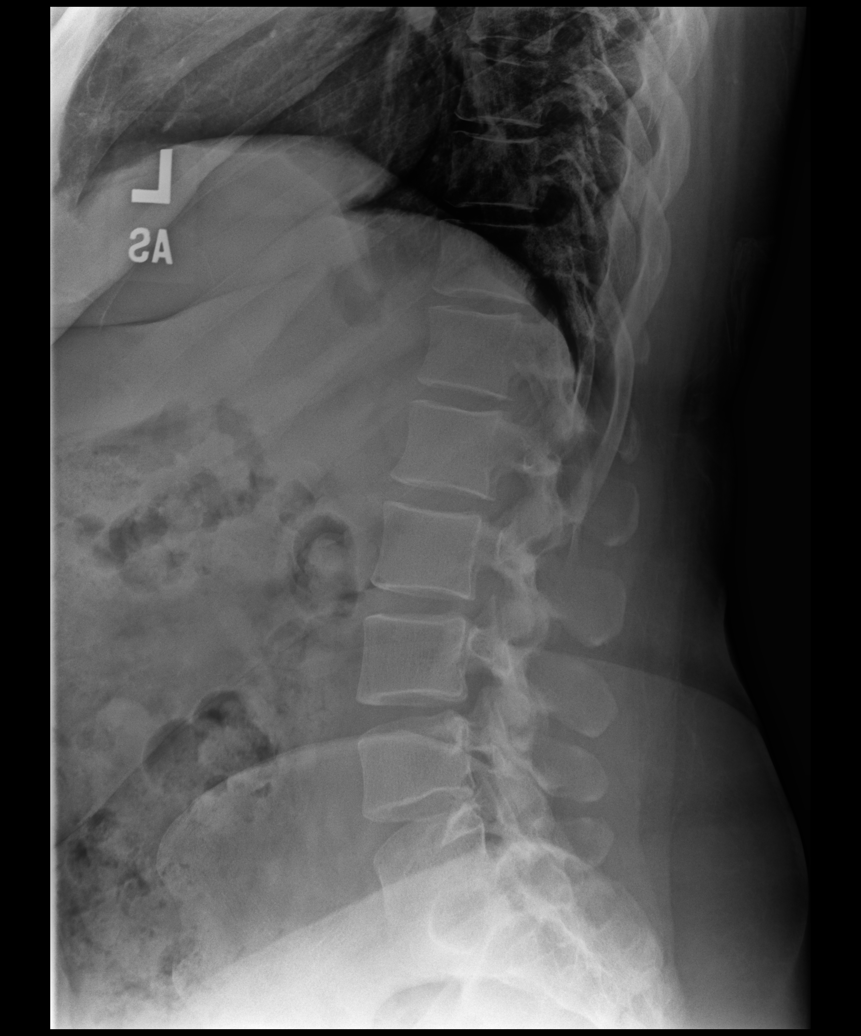

[dg lumbar spine complete 4 +v (5 of 5)]
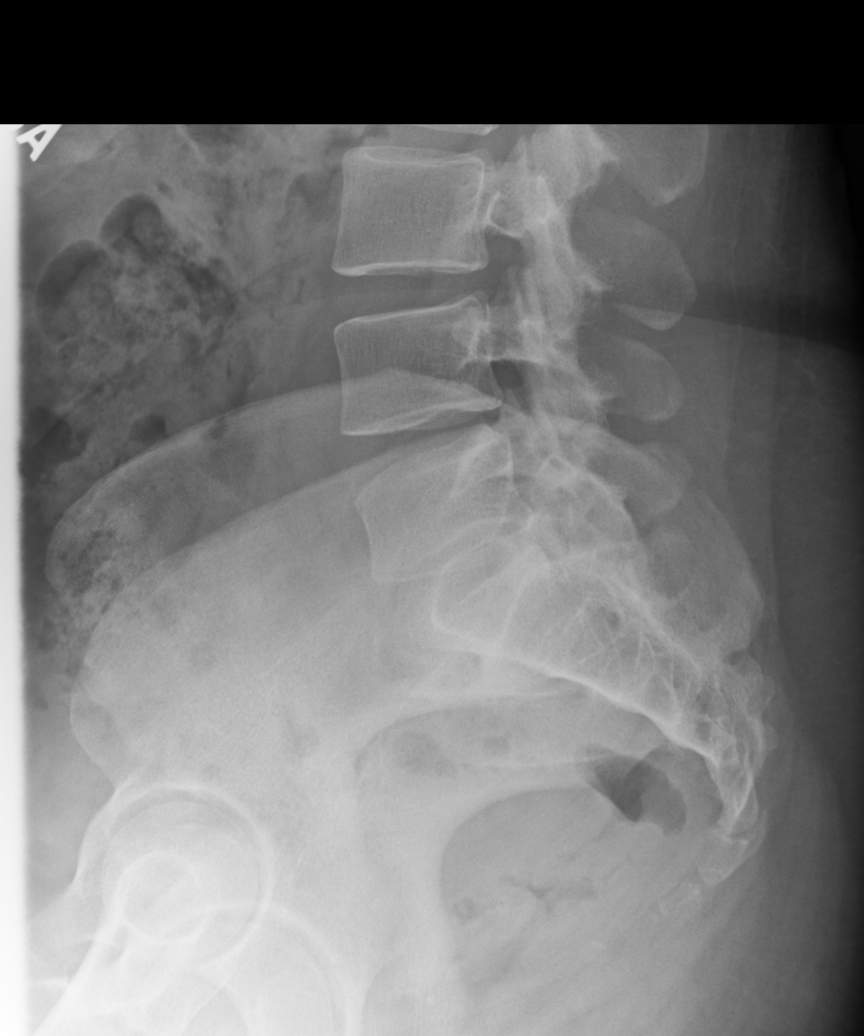

[5 of 5 positions shown; findings below may reference images not displayed]

FINDINGS: There is no evidence of lumbar spine fracture. Alignment is normal.
Intervertebral disc spaces are maintained.
IMPRESSION: Negative.

## 2022-10-03 ENCOUNTER — Encounter (HOSPITAL_COMMUNITY): Payer: Self-pay

## 2022-10-03 ENCOUNTER — Ambulatory Visit (HOSPITAL_COMMUNITY)
Admission: EM | Admit: 2022-10-03 | Discharge: 2022-10-03 | Disposition: A | Discharge status: Home / Self Care | Payer: Medicaid Other | Attending: Internal Medicine | Admitting: Internal Medicine

## 2022-10-03 DIAGNOSIS — N760 Acute vaginitis: Secondary | ICD-10-CM | POA: Diagnosis present

## 2022-10-03 LAB — POCT URINALYSIS DIPSTICK, ED / UC
Bilirubin Urine: NEGATIVE
Glucose, UA: NEGATIVE mg/dL
Ketones, ur: NEGATIVE mg/dL
Nitrite: NEGATIVE
Protein, ur: NEGATIVE mg/dL
Specific Gravity, Urine: 1.02 (ref 1.005–1.030)
Urobilinogen, UA: 0.2 mg/dL (ref 0.0–1.0)
pH: 7 (ref 5.0–8.0)

## 2022-10-03 MED ORDER — FLUCONAZOLE 150 MG PO TABS: 150.0000 mg | ORAL_TABLET | Freq: Every day | ORAL | 0 refills | Status: AC

## 2022-10-03 NOTE — ED Triage Notes (Signed)
Patient having burning with urination and vaginal itching. Onset Monday. Patient having white vaginal discharge.

## 2022-10-03 NOTE — Discharge Instructions (Signed)
Take medication as prescribed.  We will follow-up should you require any further treatment.  Please follow-up should use for any worsening or persistent symptoms.

## 2022-10-03 NOTE — ED Provider Notes (Signed)
[facility]    CSN: [id number] Arrival date & time: 10/03/22  1547      History   Chief Complaint Chief Complaint  Patient presents with   Vaginal Discharge   Dysuria    HPI Julia Chang is a 41 y.o. female presents to urgent care today with complaints of vaginal itching and burning.  Patient states symptoms consistent with previous yeast infections.  She denies any abnormal vaginal discharge, pain with urination or increased urinary frequency, hematuria or abnormal vaginal bleeding.  Patient also requesting STI screening.   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    Patient Active Problem List   Diagnosis Date Noted   Encounter for other contraceptive management 09/16/2017   Elevated blood pressure reading 09/16/2017   Unwanted fertility 09/08/2017   Low vitamin D level 02/10/2017   Chronic tension-type headache, not intractable 02/02/2017   HSV-2 infection     Past Surgical History:  Procedure Laterality Date   NO PAST SURGERIES      OB History     Gravida  5   Para  5   Term  5   Preterm      AB      Living  5      SAB      IAB      Ectopic      Multiple  0   Live Births  5            Home Medications    Prior to Admission medications   Medication Sig Start Date End Date Taking? Authorizing Provider  fluconazole (DIFLUCAN) 150 MG tablet Take 1 tablet (150 mg total) by mouth daily for 1 day. Take 1 tab immediately.  Okay to repeat in 3 days if symptoms persist 10/03/22 10/04/22 Yes Rolla Etienne, NP  cetirizine (ZYRTEC) 10 MG tablet Take 1 tablet (10 mg total) by mouth daily. 03/06/19   Georgetta Haber, NP  diphenhydrAMINE (BENADRYL) 25 MG tablet Take 1 tablet (25 mg total) by mouth every 6 (six) hours. 03/06/19   Georgetta Haber, NP  ibuprofen (ADVIL,MOTRIN) 800 MG tablet Take 1 tablet (800 mg total) by mouth 3 (three) times daily. 03/06/19   Georgetta Haber, NP   Prenat-FeAsp-Meth-FA-DHA w/o A (PRENATE PIXIE) 10-0.6-0.4-200 MG CAPS Take 1 capsule by mouth daily. 08/17/17   [provider]    Family History Family History  Problem Relation Age of Onset   Diabetes Mother    Anesthesia problems Neg Hx    Hypotension Neg Hx    Malignant hyperthermia Neg Hx    Pseudochol deficiency Neg Hx    Other Neg Hx     Social History Social History   Tobacco Use   Smoking status: Never   Smokeless tobacco: Never  Vaping Use   Vaping Use: Never used  Substance Use Topics   Alcohol use: No   Drug use: No     Allergies   Patient has no known allergies.   Review of Systems As stated in HPI otherwise negative   Physical Exam Triage Vital Signs ED Triage Vitals  Enc Vitals Group     BP 10/03/22 1649 123/71     Pulse Rate 10/03/22 1649 76     Resp 10/03/22 1649 16     Temp 10/03/22 1649 98.3 F (36.8 C)     Temp Source 10/03/22 1649 Oral     SpO2  10/03/22 1649 100 %     Weight --      Height --      Head Circumference --      Peak Flow --      Pain Score 10/03/22 1648 8     Pain Loc --      Pain Edu? --      Excl. in [place]? --    No data found.  Updated Vital Signs BP 123/71 (BP Location: Left Arm)   Pulse 76   Temp 98.3 F (36.8 C) (Oral)   Resp 16   LMP 09/14/2022 (Exact Date)   SpO2 100%   Breastfeeding No   Visual Acuity Right Eye Distance:   Left Eye Distance:   Bilateral Distance:    Right Eye Near:   Left Eye Near:    Bilateral Near:     Physical Exam Constitutional:      General: She is not in acute distress.    Appearance: Normal appearance. She is not ill-appearing or toxic-appearing.  Abdominal:     General: Bowel sounds are normal.     Palpations: Abdomen is soft.     Tenderness: There is no abdominal tenderness. There is no right CVA tenderness, left CVA tenderness, guarding or rebound.  Skin:    General: Skin is warm.  Neurological:     General: No focal deficit present.     Mental Status:  She is alert and oriented to person, place, and time.  Psychiatric:        Mood and Affect: Mood normal.        Behavior: Behavior normal.      UC Treatments / Results  Labs (all labs ordered are listed, but only abnormal results are displayed) Labs Reviewed  POCT URINALYSIS DIPSTICK, ED / UC - Abnormal; Notable for the following components:      Result Value   Hgb urine dipstick TRACE (*)    Leukocytes,Ua SMALL (*)    All other components within normal limits  URINE CULTURE  CERVICOVAGINAL ANCILLARY ONLY    EKG   Radiology No results found.  Procedures Procedures (including critical care time)  Medications Ordered in UC Medications - No data to display  Initial Impression / Assessment and Plan / UC Course  I have reviewed the triage vital signs and the nursing notes.  Pertinent labs & imaging results that were available during my care of the patient were reviewed by me and considered in my medical decision making (see chart for details).  Vaginitis -Patient states symptoms consistent with previous vaginal yeast infections.  We will go ahead and treat accordingly.  She denies any urinary symptoms though trace leukocytes and hemoglobin seen on urine dipstick.  We will send urine for culture to further assess for UTI.   -STI screening  Reviewed expections re: course of current medical issues. Questions answered. Outlined signs and symptoms indicating need for more acute intervention. Pt verbalized understanding. AVS given  Final Clinical Impressions(s) / UC Diagnoses   Final diagnoses:  Acute vaginitis   Discharge Instructions   None    ED Prescriptions     Medication Sig Dispense Auth. Provider   fluconazole (DIFLUCAN) 150 MG tablet Take 1 tablet (150 mg total) by mouth daily for 1 day. Take 1 tab immediately.  Okay to repeat in 3 days if symptoms persist 2 tablet Rudolpho Sevin, NP      PDMP not reviewed this encounter.   Rudolpho Sevin, NP 10/03/22  1806  

## 2022-10-05 LAB — CERVICOVAGINAL ANCILLARY ONLY
Bacterial Vaginitis (gardnerella): POSITIVE — AB
Candida Glabrata: NEGATIVE
Candida Vaginitis: POSITIVE — AB
Chlamydia: NEGATIVE
Comment: NEGATIVE
Comment: NEGATIVE
Comment: NEGATIVE
Comment: NEGATIVE
Comment: NEGATIVE
Comment: NORMAL
Neisseria Gonorrhea: NEGATIVE
Trichomonas: NEGATIVE

## 2022-10-06 ENCOUNTER — Telehealth (HOSPITAL_COMMUNITY): Payer: Self-pay | Admitting: Emergency Medicine

## 2022-10-06 MED ORDER — METRONIDAZOLE 500 MG PO TABS: 500.0000 mg | ORAL_TABLET | Freq: Two times a day (BID) | ORAL | 0 refills | Status: DC

## 2023-01-03 ENCOUNTER — Ambulatory Visit (HOSPITAL_COMMUNITY)
Admission: EM | Admit: 2023-01-03 | Discharge: 2023-01-03 | Disposition: A | Discharge status: Home / Self Care | Payer: Medicaid Other | Attending: Emergency Medicine | Admitting: Emergency Medicine

## 2023-01-03 ENCOUNTER — Ambulatory Visit (INDEPENDENT_AMBULATORY_CARE_PROVIDER_SITE_OTHER): Payer: Medicaid Other

## 2023-01-03 ENCOUNTER — Encounter (HOSPITAL_COMMUNITY): Payer: Self-pay

## 2023-01-03 DIAGNOSIS — S4991XA Unspecified injury of right shoulder and upper arm, initial encounter: Secondary | ICD-10-CM

## 2023-01-03 DIAGNOSIS — W19XXXA Unspecified fall, initial encounter: Secondary | ICD-10-CM | POA: Diagnosis not present

## 2023-01-03 DIAGNOSIS — M25511 Pain in right shoulder: Secondary | ICD-10-CM

## 2023-01-03 MED ORDER — TRAMADOL HCL 50 MG PO TABS: 50.0000 mg | ORAL_TABLET | Freq: Four times a day (QID) | ORAL | 0 refills | Status: AC | PRN

## 2023-01-03 MED ORDER — ACETAMINOPHEN 325 MG PO TABS
650.0000 mg | ORAL_TABLET | Freq: Once | ORAL | Status: AC
  Administered 2023-01-03: 650 mg via ORAL

## 2023-01-03 MED ORDER — ACETAMINOPHEN 325 MG PO TABS
ORAL_TABLET | ORAL | Status: AC
  Filled 2023-01-03: qty 2

## 2023-01-03 NOTE — ED Provider Notes (Signed)
[place]    CSN: [id number] Arrival date & time: 01/03/23  1303     History   Chief Complaint Chief Complaint  Patient presents with   Fall    HPI Julia Chang is a 42 y.o. female.  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    Patient Active Problem List   Diagnosis Date Noted   Encounter for other contraceptive management 09/16/2017   Elevated blood pressure reading 09/16/2017   Unwanted fertility 09/08/2017   Low vitamin D level 02/10/2017   Chronic tension-type headache, not intractable 02/02/2017   HSV-2 infection     Past Surgical History:  Procedure Laterality Date   NO PAST SURGERIES      OB History     Gravida  5   Para  5   Term  5   Preterm      AB      Living  5      SAB      IAB      Ectopic      Multiple  0   Live Births  5            Home Medications    Prior to Admission medications   Medication Sig Start Date End Date Taking? Authorizing Provider  cetirizine (ZYRTEC) 10 MG tablet Take 1 tablet (10 mg total) by mouth daily. 03/06/19  Yes Burky, Lanelle Bal B, NP  ibuprofen (ADVIL,MOTRIN) 800 MG tablet Take 1 tablet (800 mg total) by mouth 3 (three) times daily. 03/06/19  Yes Burky, Malachy Moan, NP  traMADol (ULTRAM) 50 MG tablet Take 1 tablet (50 mg total) by mouth every 6 (six) hours as needed for up to 3 days. 01/03/23 01/06/23 Yes Molly Maselli, Wells Guiles, PA-C    Family History Family History  Problem Relation Age of Onset   Diabetes Mother    Anesthesia problems Neg Hx    Hypotension Neg Hx    Malignant hyperthermia Neg Hx    Pseudochol deficiency Neg Hx    Other Neg Hx     Social  History Social History   Tobacco Use   Smoking status: Never   Smokeless tobacco: Never  Vaping Use   Vaping Use: Never used  Substance Use Topics   Alcohol use: No   Drug use: No     Allergies   Patient has no known allergies.   Review of Systems Review of Systems As per HPI  Physical Exam Triage Vital Signs ED Triage Vitals  Enc Vitals Group     BP 01/03/23 1428 133/83     Pulse Rate 01/03/23 1428 80     Resp 01/03/23 1428 16     Temp 01/03/23 1428 99 F (37.2 C)     Temp Source 01/03/23 1428 Oral     SpO2 01/03/23 1428 98 %     Weight --      Height --      Head Circumference --      Peak Flow --      Pain Score 01/03/23 1425 9     Pain Loc --  Pain Edu? --      Excl. in [place]? --    No data found.  Updated Vital Signs BP 133/83 (BP Location: Left Arm)   Pulse 80   Temp 99 F (37.2 C) (Oral)   Resp 16   LMP 01/02/2023 Comment: pt is currently on menstrual cycle  SpO2 98%    Physical Exam Vitals and nursing note reviewed.  Constitutional:      General: She is not in acute distress. HENT:     Head: Atraumatic.     Mouth/Throat:     Pharynx: Oropharynx is clear.  Cardiovascular:     Rate and Rhythm: Normal rate and regular rhythm.     Pulses: Normal pulses.     Heart sounds: Normal heart sounds.  Pulmonary:     Effort: Pulmonary effort is normal.     Breath sounds: Normal breath sounds.  Musculoskeletal:     Right shoulder: Deformity, tenderness and bony tenderness present. Decreased range of motion. Normal strength. Normal pulse.     Comments: R shoulder appears sloped forward. Tender shoulder into upper deltoid. Very limited ROM. Grip strength intact. Cap refill < 2 seconds, strong radial pulse  Skin:    General: Skin is warm and dry.  Neurological:     Mental Status: She is alert and oriented to person, place, and time.     UC Treatments / Results  Labs (all labs ordered are listed, but only abnormal results are displayed) Labs  Reviewed - No data to display  EKG  Radiology DG Shoulder Right  Result Date: 01/03/2023 CLINICAL DATA:  Pain after fall. EXAM: RIGHT SHOULDER - 2+ VIEW COMPARISON:  None Available. FINDINGS: There is no evidence of fracture or dislocation. There is no evidence of arthropathy or other focal bone abnormality. Soft tissues are unremarkable. IMPRESSION: Negative. Electronically Signed   By: Gerome Sam III M.D.   On: 01/03/2023 15:21    Procedures Procedures (including critical care time)  Medications Ordered in UC Medications  acetaminophen (TYLENOL) tablet 650 mg (650 mg Oral Given 01/03/23 1538)    Initial Impression / Assessment and Plan / UC Course  I have reviewed the triage vital signs and the nursing notes.  Pertinent labs & imaging results that were available during my care of the patient were reviewed by me and considered in my medical decision making (see chart for details).  Tylenol dose given R shoulder xray negative Likely separation/tear Sling provided. Close follow up with ortho Ibu/tylenol for pain. PDMP reviewed; sent a few tabs tramadol for prn breakthrough pain Discussed precautions, patient agrees to plan   Final Clinical Impressions(s) / UC Diagnoses   Final diagnoses:  Injury of right shoulder, initial encounter     Discharge Instructions      Xray is negative. You likely have a soft tissue injury such as muscle or ligament tear Please wear the sling day and night for support.  Continue tylenol and ibuprofen for pain control.  The tramadol (narcotic) can be used every 6 hours as needed for breakthrough pain. Please do not drink, drive, or make judgement decisions if you choose to use this medication.   Please call the orthopedic specialist (either clinic) first thing tomorrow to make an appointment.      ED Prescriptions     Medication Sig Dispense Auth. Provider   traMADol (ULTRAM) 50 MG tablet Take 1 tablet (50 mg total) by mouth every 6  (six) hours as needed for up to 3 days.  12 tablet Edris Schneck, Wells Guiles, PA-C      I have reviewed the PDMP during this encounter.   [place], [place] 01/03/23 1842

## 2023-01-03 NOTE — Discharge Instructions (Addendum)
Xray is negative. You likely have a soft tissue injury such as muscle or ligament tear Please wear the sling day and night for support.  Continue tylenol and ibuprofen for pain control.  The tramadol (narcotic) can be used every 6 hours as needed for breakthrough pain. Please do not drink, drive, or make judgement decisions if you choose to use this medication.   Please call the orthopedic specialist (either clinic) first thing tomorrow to make an appointment.

## 2023-01-03 NOTE — ED Triage Notes (Signed)
Pt had a fall last night and injured  shoulder and arm causing pain and discomfort.

## 2023-04-26 ENCOUNTER — Other Ambulatory Visit: Payer: Self-pay

## 2023-04-26 ENCOUNTER — Ambulatory Visit: Payer: Medicaid Other | Attending: Orthopedic Surgery | Admitting: Physical Therapy

## 2023-04-26 ENCOUNTER — Encounter: Payer: Self-pay | Admitting: Physical Therapy

## 2023-04-26 DIAGNOSIS — M25611 Stiffness of right shoulder, not elsewhere classified: Secondary | ICD-10-CM | POA: Diagnosis present

## 2023-04-26 DIAGNOSIS — M6281 Muscle weakness (generalized): Secondary | ICD-10-CM | POA: Diagnosis present

## 2023-04-26 DIAGNOSIS — M25511 Pain in right shoulder: Secondary | ICD-10-CM
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
## 2023-04-29 ENCOUNTER — Ambulatory Visit: Payer: Medicaid Other | Admitting: Physical Therapy

## 2023-04-29 ENCOUNTER — Encounter: Payer: Self-pay | Admitting: Physical Therapy

## 2023-04-29 DIAGNOSIS — M25511 Pain in right shoulder: Secondary | ICD-10-CM | POA: Diagnosis not present

## 2023-04-29 DIAGNOSIS — M25611 Stiffness of right shoulder, not elsewhere classified: Secondary | ICD-10-CM

## 2023-04-29 DIAGNOSIS — M6281 Muscle weakness (generalized): Secondary | ICD-10-CM

## 2023-05-04 ENCOUNTER — Telehealth: Payer: Self-pay | Admitting: Physical Therapy

## 2023-05-04 ENCOUNTER — Ambulatory Visit: Payer: Medicaid Other | Admitting: Physical Therapy
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
## 2023-05-04 NOTE — Telephone Encounter (Signed)
Left voicemail regarding no show to appointment this morning. Left reminder of next appointment date and time.

## 2023-05-05 ENCOUNTER — Ambulatory Visit: Payer: Medicaid Other | Admitting: Physical Therapy

## 2023-05-05 ENCOUNTER — Encounter: Payer: Self-pay | Admitting: Physical Therapy

## 2023-05-05 DIAGNOSIS — M25611 Stiffness of right shoulder, not elsewhere classified: Secondary | ICD-10-CM

## 2023-05-05 DIAGNOSIS — M25511 Pain in right shoulder: Secondary | ICD-10-CM | POA: Diagnosis not present

## 2023-05-05 DIAGNOSIS — M6281 Muscle weakness (generalized): Secondary | ICD-10-CM
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
## 2023-05-07 ENCOUNTER — Ambulatory Visit: Payer: Medicaid Other | Admitting: Physical Therapy

## 2023-05-07 ENCOUNTER — Encounter: Payer: Self-pay | Admitting: Physical Therapy

## 2023-05-07 DIAGNOSIS — M25511 Pain in right shoulder: Secondary | ICD-10-CM

## 2023-05-07 DIAGNOSIS — M25611 Stiffness of right shoulder, not elsewhere classified: Secondary | ICD-10-CM

## 2023-05-07 DIAGNOSIS — M6281 Muscle weakness (generalized): Secondary | ICD-10-CM

## 2023-05-13 ENCOUNTER — Telehealth: Payer: Self-pay | Admitting: Physical Therapy

## 2023-05-13 ENCOUNTER — Encounter: Payer: Self-pay | Admitting: Physical Therapy

## 2023-05-13 ENCOUNTER — Ambulatory Visit: Payer: Medicaid Other | Admitting: Physical Therapy

## 2023-05-13 NOTE — Telephone Encounter (Signed)
Called patient to inform of cancellation, no answer, lvm.

## 2023-05-14 ENCOUNTER — Ambulatory Visit: Payer: Medicaid Other | Admitting: Physical Therapy

## 2023-05-19 ENCOUNTER — Ambulatory Visit: Payer: Medicaid Other | Admitting: Physical Therapy

## 2023-05-21 ENCOUNTER — Encounter: Payer: Self-pay | Admitting: Physical Therapy

## 2023-05-21 ENCOUNTER — Ambulatory Visit: Payer: Medicaid Other | Attending: Orthopedic Surgery | Admitting: Physical Therapy

## 2023-05-21 DIAGNOSIS — M25511 Pain in right shoulder: Secondary | ICD-10-CM | POA: Insufficient documentation

## 2023-05-21 DIAGNOSIS — M25611 Stiffness of right shoulder, not elsewhere classified: Secondary | ICD-10-CM | POA: Insufficient documentation

## 2023-05-21 DIAGNOSIS — M6281 Muscle weakness (generalized): Secondary | ICD-10-CM | POA: Insufficient documentation
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
## 2023-05-26 ENCOUNTER — Ambulatory Visit: Payer: Medicaid Other | Admitting: Physical Therapy

## 2023-05-27 ENCOUNTER — Ambulatory Visit: Payer: Medicaid Other | Admitting: Physical Therapy

## 2023-05-31 ENCOUNTER — Ambulatory Visit: Payer: Medicaid Other | Admitting: Physical Therapy

## 2023-06-02 ENCOUNTER — Ambulatory Visit: Payer: Medicaid Other | Admitting: Physical Therapy

## 2023-06-02 ENCOUNTER — Encounter: Payer: Self-pay | Admitting: Physical Therapy

## 2023-06-02 DIAGNOSIS — M6281 Muscle weakness (generalized): Secondary | ICD-10-CM

## 2023-06-02 DIAGNOSIS — M25611 Stiffness of right shoulder, not elsewhere classified: Secondary | ICD-10-CM

## 2023-06-02 DIAGNOSIS — M25511 Pain in right shoulder: Secondary | ICD-10-CM | POA: Diagnosis not present

## 2023-06-02 NOTE — Therapy (Addendum)
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   Past Surgical History:  Procedure Laterality Date   NO PAST SURGERIES     Patient Active Problem List   Diagnosis Date Noted   Encounter for other contraceptive management 09/16/2017   Elevated blood pressure reading 09/16/2017   Unwanted fertility 09/08/2017   Low vitamin D level 02/10/2017   Chronic tension-type headache, not intractable 02/02/2017   HSV-2 infection     PCP: No PCP in chart  REFERRING PROVIDER: Teryl Lucy, MD  REFERRING DIAG: [phone] (ICD-10-CM) - S/P rotator cuff repair  THERAPY DIAG:  Right shoulder pain, unspecified chronicity  Stiffness of right shoulder, not elsewhere classified  Muscle weakness (generalized)  Rationale for Evaluation and Treatment: Rehabilitation  ONSET DATE: 03/11/23  SUBJECTIVE:                                                                                                                                                                                      SUBJECTIVE STATEMENT: 06/02/2023 Pt arrives w/ reports of intermittent throbbing in upper arm rated at 5/10. Reports compliance with HEP.    Eval-Pt arrives w/o sling, states she was using it until last MD follow up (last week), now using PRN for comfort and in public. States since she came out of sling she has been using her shoulder as tolerated, but has difficulty lifting her arm. Also tends  to rely on her L UE for most activities, has increased R shoulder pain as she fatigues. States using her sling resolves pain quickly, also helps with sleeping. Denies N/T and states incisions are healing well, denies formal precautions. Able to perform most activities with LUE.  Hand dominance: Right  PERTINENT HISTORY: S/p R RCR 03/11/23. Chronic headaches  PAIN:  Are you having pain: 3/10 Location/description: R shoulder,upper arm / throbbing  Per eval -  Best-worst over past week: 0-8/10 (calms quickly with sling) - aggravating factors:  movement, lifting, sitting unsupported <30 minutes - Easing factors: sling use, rest    PRECAUTIONS: Shoulder (using [facility] protocol)  WEIGHT BEARING RESTRICTIONS: No  FALLS:  Has patient fallen in last 6 months? Yes. 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  PLOF: Independent  PATIENT GOALS:be back to "normal", be able to do better with childcare  NEXT MD VISIT: June 12th  OBJECTIVE: (objective measures completed at initial evaluation unless otherwise dated)   DIAGNOSTIC FINDINGS:  S/p R RCR on 03/11/23  PATIENT SURVEYS:  FOTO 49 current, 66 predicted  COGNITION: Overall cognitive status: Within functional limits for tasks assessed     SENSATION: Denies sensory complaints, appears grossly intact with exam  POSTURE: Forward head, elevated B UT, RUE slightly guarded  UPPER EXTREMITY ROM:  A/PROM Right eval Left eval Right PROM 05/05/23 Right  PROM 05/21/23 Right 06/02/23 AROM/PROM  Shoulder flexion NT/ 80 deg  painful laterally   ~90 deg limited by pull 120 pain  125/130  Shoulder abduction NT/ 50 deg painful laterally    70 deg limited by pulling  90 108 with shrug/100  Shoulder internal rotation     Reaches iliac crest/55  Shoulder external rotation (at ~15 deg abduction)   28 deg limited by nonpainful pulling  45 Reaches back of head 45  Elbow flexion       Elbow extension       Wrist flexion       Wrist extension        (Blank rows = not tested) (Key: WFL = within functional limits not formally assessed, * = concordant pain, s = stiffness/stretching sensation, NT = not tested)  Comments: PROM limited by muscle guarding on eval   UPPER EXTREMITY MMT:  MMT Right eval Left eval  Shoulder flexion    Shoulder extension    Shoulder abduction    Shoulder extension    Shoulder internal rotation    Shoulder external rotation    Elbow flexion    Elbow extension    Grip strength    (Blank rows = not tested)  (Key: WFL = within functional limits not formally assessed, * = concordant pain, s = stiffness/stretching sensation, NT = not tested)  Comments: deferred given proximity to surgery  SHOULDER SPECIAL TESTS: Deferred given surgical status  JOINT MOBILITY TESTING:  Deferred, muscle guarding apparent w/ PROM  PALPATION:  deferred   TODAY'S  [facility] Adult PT Treatment:                                                DATE: 06/02/23 Therapeutic Exercise: Supine pullovers with dowel- full long lever Short lever flexion x 10  S/L shoulder ER  Pendulums CW/CCW x12 each Pendulums horizontal x12 Shoulder flexion isometrics in neutral, x8 with cues for appropriate exertion and form Shoulder IR isometrics in neutral 5 sec x 8 Shoulder ER isometrics in neutral  5  sec x 8 Manual Therapy: Grade 2-3 GHJ mobs for inferior and posterior glides.  PROM Flexion, Abduct, ER    [facility] Adult PT Treatment:  DATE: 05/21/23 Therapeutic Exercise: Pendulums CW/CCW x12 each Pendulums horizontal x12 Shoulder flexion isometrics in neutral, x8 with cues for appropriate  exertion and form Shoulder abduction isometrics in neutral, x8 with cues for appropriate exertion and form - increases pain Shoulder IR isometrics in neutral 5 sec x 8 Shoulder ER isometrics in neutral  5  sec x 8 Supine clasped hand AAROM short levet Supine long lever pullovers with dowel small ROM Chest and above  Supine AAROM ER with dowel from neutral  Updated HEP  Manual Therapy: Grade 2-3 GHJ mobs for inferior and posterior glides.  PROM Flexion, Abduct, ER  Modalities: Cold pack end of session x 10 minutes    TREATMENT:    [facility] Adult PT Treatment:                                                DATE: 05/07/23 Therapeutic Exercise: Shoulder flexion passive walkbacks x12 cues for minimizing WB, appropriate ROM  Supine shoulder flexion AAROM 2x8 able to achieve ~90 deg, cues for pacing and comfortable ROM  Short lever shoulder flexion AAROM x12 cues for form and comfortable ROM  Shoulder flexion isometrics in neutral, x8 with cues for appropriate exertion and form Shoulder abduction isometrics in neutral, x8 with cues for appropriate exertion and form  Shoulder rolls fwd/back 2x8 each cues for comfortable ROM  Sidelying R shoulder protraction/retraction 2x12 cues for elbow/shoulder positioning and improved scapular mechanics  Seated scapular retractions x15 cues for form and reduced UT compensations  HEP update + education/handout   [facility] Adult PT Treatment:                                                DATE: 05/05/23 Therapeutic Exercise: Pendulums CW/CCW x12 each Pendulums horizontal x12 Shoulder flexion passive walkbacks 2x10 cues for minimizing WB and appropriate ROM  Short lever supine GH flex AAROM x10 cues for comfortable ROM  Long lever supine GH flexion w dowel AAROM 2x8 cues for form and ROM Sidelying scapular protraction/retraction for improved scapular mobility 2x8 tactile/verbal cues as needed  for reduced compensations  Manual Therapy: passive physiological  movement  R GHJ flex, scap, abduction, and ER to pt tolerance with gentle oscillations to reduce muscle guarding   [facility] Adult PT Treatment:                                                DATE: 04-29-23 Therapeutic Exercise: Pendulums x10 CW/CCW cues for appropriate setup  Pendulum side to side and forward backward Scapular retractions x 10 cues for posture  Table shld slide for increase flexion with towel on raised mat. Table shld slide for increase abd as tolerated with towel on raised mat. Seated windshield wiper with towel on raised mat Manual Therapy: STW over R biceps and and  scapular mobility in Left side-lying Supine biceps STW and  Gentle GHJ mobs Grade  II KT tape for edema control and proprioceptive input with on split I tapes and 2 I tapes 50% pull  Self Care: Use of cold and heat  for home use  [facility] Adult PT Treatment:                                                DATE: 04/26/23 Therapeutic Exercise: Supine dowel flexion AAROM x2 deferred given limited tolerance, able to perform ~15deg before fatigue Scapular retractions x 8 cues for posture Pendulums x8 CW/CCW cues for appropriate setup Shoulder flexion passive walkbacks x8, emphasis on avoiding full WB, comfortable ROM   Manual Therapy: PROM flex/abduction RUE to pt tolerance  PATIENT EDUCATION: Education details: rationale for interventions, HEP, safety w/ activity Person educated: Patient Education method: Explanation, Demonstration, Tactile cues, Verbal cues, and Handouts Education comprehension: verbalized understanding, returned demonstration, verbal cues required, tactile cues required, and needs further education    HOME EXERCISE PROGRAM: Access Code: [email or web address] URL: [email or web address] Date: 05/07/2023 Prepared by: Fransisco Hertz  Exercises - Standing 'L'  Stretch at Counter  - 1 x daily - 7 x weekly - 3 sets - 8 reps - Supine Shoulder Flexion AAROM with Hands Clasped  - 1 x daily - 7 x weekly - 3 sets - 10 reps - Shoulder Rolls in Sitting  - 1 x daily - 7 x weekly - 3 sets - 10 reps 05/21/23:  - Standing Isometric Shoulder Flexion with Doorway - Arm Bent  - 1 x daily - 7 x weekly - 1 sets - 8 reps - Standing Isometric Shoulder External Rotation with Doorway  - 1 x daily - 7 x weekly - 1 sets - 8 reps - Standing Isometric Shoulder Internal Rotation at Doorway  - 1 x daily - 7 x weekly - 1 sets - 8 reps  06/02/23  - punch without weight  - 1 x daily - 7 x weekly - 2 sets - 10 reps - Sidelying Shoulder External Rotation  - 1 x daily - 7 x weekly - 2 sets - 10 reps  ASSESSMENT:  CLINICAL IMPRESSION: 06/02/2023 Pt arrives  3/10 pain , reports compliance with HEP and min soreness after last session. Continued with isometrics and AAROM. Pt is 11 weeks S/p RCR. Captured AROM measurements today indicating improvement in ROM and strength. Progressed with AROM for flexion and ER and updated HEP.     EVAL-Pt is a 42 year old woman who arrives to PT evaluation on this date for shoulder pain s/p R RCR on 03/11/23. Pt reports difficulty with daily/household tasks due to pain and surgical healing. Exam is limited due to muscle guarding, PROM flex/abd limited as above. Deferred AROM/MMT given proximity to surgery. Per [facility] protocol, attempt to prescribe supine AAROM on this date although pt has limited tolerance and notable difficulty with this task. Given this, modification for HEP to emphasize reduced muscle guarding, comfortable PROM, and scapular mobility which she tolerates well. Education provided on safety w/ activity, monitoring symptoms, etc. Recommend skilled PT to progress pt along surgical protocol as appropriate in order to address [facility] mobility/strength and maximize functional independence. No adverse events, pt denies any increase in pain on  departure. Pt departs today's session in no acute distress, all voiced questions/concerns addressed appropriately from PT perspective.    OBJECTIVE IMPAIRMENTS: decreased activity tolerance, decreased endurance, decreased mobility, decreased ROM, decreased strength, impaired perceived functional ability, impaired flexibility, impaired UE functional use, postural dysfunction, and pain.   ACTIVITY LIMITATIONS: carrying, lifting, sitting, sleeping, bathing, dressing, reach over head,  hygiene/grooming, and caring for others  PARTICIPATION LIMITATIONS: meal prep, cleaning, laundry, and driving  PERSONAL FACTORS: Time since onset of injury/illness/exacerbation are also affecting patient's functional outcome.   REHAB POTENTIAL: Good  CLINICAL DECISION MAKING: Stable/uncomplicated  EVALUATION COMPLEXITY: Low   GOALS: Goals reviewed with patient? No  SHORT TERM GOALS: Target date: 05/24/2023 Pt will demonstrate appropriate understanding and performance of initially prescribed HEP in order to facilitate improved independence with management of symptoms.  Baseline: HEP provided on eval Goal status: MET  2. Pt will score greater than or equal to 55 on FOTO in order to demonstrate improved perception of function due to symptoms.  Baseline: 49  Goal status: INITIAL    LONG TERM GOALS: Target date: 06/21/2023 Pt will score 66 on FOTO in order to demonstrate improved perception of function due to symptoms. Baseline: 49 Goal status: INITIAL  2.  Pt will demonstrate at least 150 degrees of active shoulder elevation on surgical limb in order to demonstrate improved tolerance to functional movement patterns such as reaching overhead.  Baseline: see ROM chart above, PROM only on eval Goal status: INITIAL  3.  Pt will demonstrate at least 4+/5 shoulder flex/abduction MMT for improved symmetry of UE strength and improved tolerance to functional movements.  Baseline: see MMT chart above Goal status:  INITIAL  4. Pt will demo ability to lift up to 20# with BIL UE and less than 2 pt increase in pain on NPS in order to facilitate improved functional independence with household/caregiving tasks.  Baseline: using LUE for household tasks, limiting lifting  Goal status: INITIAL   5. Pt will report at least 50% decrease in overall pain levels in past week in order to facilitate improved tolerance to basic ADLs/mobility.   Baseline: 0-8/10 in past week   Goal status: INITIAL    PLAN:  PT FREQUENCY: 2x/week  PT DURATION: 8 weeks  PLANNED INTERVENTIONS: Therapeutic exercises, Therapeutic activity, Neuromuscular re-education, Balance training, Gait training, Patient/Family education, Self Care, Joint mobilization, Dry Needling, Electrical stimulation, Spinal mobilization, Cryotherapy, Moist heat, scar mobilization, Taping, Manual therapy, and Re-evaluation  PLAN FOR NEXT SESSION: review/update HEP PRN. Emphasis on comfortable PROM, AAROM within pt tolerance, flex/abd isometrics as able/appropriate. Utilizing [facility] rotator cuff repair protocol     Jannette Spanner, PTA 06/02/23 1:10 PM Phone: [phone] Fax: [phone]      Discharge addendum 11/25/2023  PHYSICAL THERAPY DISCHARGE SUMMARY  Visits from Start of Care: 6  Current functional level related to goals / functional outcomes: Unable to be assessed   Remaining deficits: Unable to be assessed   Education / Equipment: Unable to be assessed  Patient goals were unable to be assessed. Patient is being discharged due to not returning since the last visit.  Ashley Murrain PT, DPT 11/25/2023 9:50 AM

## 2023-06-07 ENCOUNTER — Ambulatory Visit: Payer: Medicaid Other | Admitting: Physical Therapy

## 2023-06-09 ENCOUNTER — Ambulatory Visit: Payer: Medicaid Other | Admitting: Physical Therapy

## 2023-06-14 ENCOUNTER — Ambulatory Visit: Payer: Medicaid Other | Admitting: Physical Therapy

## 2023-06-16 ENCOUNTER — Ambulatory Visit: Payer: Medicaid Other | Admitting: Physical Therapy

## 2024-03-22 ENCOUNTER — Other Ambulatory Visit: Payer: Self-pay

## 2024-05-26 ENCOUNTER — Ambulatory Visit (HOSPITAL_COMMUNITY)
Admission: RE | Admit: 2024-05-26 | Discharge: 2024-05-26 | Disposition: A | Discharge status: Home / Self Care | Payer: Self-pay | Source: Ambulatory Visit | Attending: Emergency Medicine | Admitting: Emergency Medicine

## 2024-05-26 ENCOUNTER — Ambulatory Visit (INDEPENDENT_AMBULATORY_CARE_PROVIDER_SITE_OTHER)

## 2024-05-26 ENCOUNTER — Encounter (HOSPITAL_COMMUNITY): Payer: Self-pay

## 2024-05-26 VITALS — BP 137/84 | HR 95 | Temp 98.8°F | Resp 16

## 2024-05-26 DIAGNOSIS — M25562 Pain in left knee: Secondary | ICD-10-CM

## 2024-05-26 DIAGNOSIS — S86912A Strain of unspecified muscle(s) and tendon(s) at lower leg level, left leg, initial encounter: Secondary | ICD-10-CM | POA: Diagnosis not present

## 2024-05-26 MED ORDER — NAPROXEN 500 MG PO TABS: 500.0000 mg | ORAL_TABLET | Freq: Two times a day (BID) | ORAL | 0 refills | Status: AC

## 2024-05-26 NOTE — ED Provider Notes (Signed)
 [facility]    CSN: [id number] Arrival date & time: 05/26/24  4098      History   Chief Complaint Chief Complaint  Patient presents with   Knee Injury    Slipped and fell coming out of store on today. Fell on knees to break the fall. Knee hurts when bent - Entered by patient   Knee Pain    HPI Julia Chang is a 43 y.o. female.   Patient presents with left knee pain after fall that occurred last night.  Patient states that she was walking out of a store when she slipped and fell in the parking lot and landed on her left knee.  Patient states that she now has pain to the posterior aspect of her left knee with walking and upon movement.  Patient does have a superficial abrasion to her anterior knee.    Patient reports that she did take Tylenol  last night with some relief.  Patient denies any other injuries from fall.  Patient denies hitting her head or loss of consciousness.  Patient denies taking any blood thinners.  The history is provided by the patient and medical records.  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    Patient Active Problem List   Diagnosis Date Noted   Encounter for other contraceptive management 09/16/2017   Elevated blood pressure reading 09/16/2017   Unwanted fertility 09/08/2017   Low vitamin D  level 02/10/2017   Chronic tension-type headache, not intractable 02/02/2017   HSV-2 infection     Past Surgical History:  Procedure Laterality Date   NO PAST SURGERIES     ROTATOR CUFF REPAIR Right     OB History     Gravida  5   Para  5   Term  5   Preterm      AB      Living  5      SAB      IAB      Ectopic      Multiple  0   Live Births  5            Home Medications    Prior to Admission medications   Medication Sig Start Date End Date Taking? Authorizing Provider  naproxen (NAPROSYN) 500 MG tablet Take 1 tablet (500 mg total) by mouth 2 (two)  times daily. 05/26/24  Yes Karon Packer, NP    Family History Family History  Problem Relation Age of Onset   Diabetes Mother    Anesthesia problems Neg Hx    Hypotension Neg Hx    Malignant hyperthermia Neg Hx    Pseudochol deficiency Neg Hx    Other Neg Hx     Social History Social History   Tobacco Use   Smoking status: Never   Smokeless tobacco: Never  Vaping Use   Vaping status: Never Used  Substance Use Topics   Alcohol use: No   Drug use: No     Allergies   Patient has no known allergies.   Review of Systems Review of Systems  Per HPI  Physical Exam Triage Vital Signs ED Triage Vitals  Encounter Vitals Group     BP 05/26/24 0902 137/84     Girls Systolic BP Percentile --      Girls Diastolic BP Percentile --      Boys Systolic BP Percentile --      Boys  Diastolic BP Percentile --      Pulse Rate 05/26/24 0902 95     Resp 05/26/24 0902 16     Temp 05/26/24 0902 98.8 F (37.1 C)     Temp Source 05/26/24 0902 Oral     SpO2 05/26/24 0902 98 %     Weight --      Height --      Head Circumference --      Peak Flow --      Pain Score 05/26/24 0903 8     Pain Loc --      Pain Education --      Exclude from Growth Chart --    No data found.  Updated Vital Signs BP 137/84 (BP Location: Left Arm)   Pulse 95   Temp 98.8 F (37.1 C) (Oral)   Resp 16   LMP 05/10/2024   SpO2 98%   Visual Acuity Right Eye Distance:   Left Eye Distance:   Bilateral Distance:    Right Eye Near:   Left Eye Near:    Bilateral Near:     Physical Exam Vitals and nursing note reviewed.  Constitutional:      General: She is awake. She is not in acute distress.    Appearance: Normal appearance. She is well-developed and well-groomed. She is not ill-appearing.   Musculoskeletal:     Left knee: No swelling or deformity. Normal range of motion. Tenderness present over the PCL.   Skin:    General: Skin is warm and dry.     Findings: Abrasion present.      Comments: Small superficial abrasion noted to the anterior left knee   Neurological:     Mental Status: She is alert.   Psychiatric:        Behavior: Behavior is cooperative.      UC Treatments / Results  Labs (all labs ordered are listed, but only abnormal results are displayed) Labs Reviewed - No data to display  EKG   Radiology DG Knee Complete 4 Views Left Addendum Date: 05/26/2024 ADDENDUM REPORT: 05/26/2024 12:57 CLINICAL DATA:  Pain after fall EXAM: LEFT KNEE - COMPLETE 4+ VIEW COMPARISON:  None Available. FINDINGS: No fracture or dislocation. Preserved joint spaces and bone mineralization. No joint effusion on lateral view. Mild hyperostosis. IMPRESSION: No acute osseous abnormality. Electronically Signed   By: Adrianna Horde M.D.   On: 05/26/2024 12:57   Result Date: 05/26/2024 CLINICAL DATA:  Pain after fall EXAM: LEFT KNEE - COMPLETE 4 VIEW COMPARISON:  None Available. FINDINGS: No fracture or dislocation. Preserved joint spaces and bone mineralization. No joint effusion on lateral view. Mild hyperostosis. IMPRESSION: No acute cardiopulmonary disease. Electronically Signed: By: Adrianna Horde M.D. On: 05/26/2024 12:04    Procedures Procedures (including critical care time)  Medications Ordered in UC Medications - No data to display  Initial Impression / Assessment and Plan / UC Course  I have reviewed the triage vital signs and the nursing notes.  Pertinent labs & imaging results that were available during my care of the patient were reviewed by me and considered in my medical decision making (see chart for details).     Patient is overall well-appearing.  Vitals are stable.  Upon assessment there is some mild tenderness noted just over the PCL of the left knee.  Without swelling, erythema, decreased range of motion, or inability to ambulate.  There is a small superficial abrasion noted to the anterior knee as well.  X-ray ordered.  Based on my interpretation there is  no acute fracture or underlying injury noted on x-ray.  Radiology report confirms this.  Provided patient with knee sleeve.  Prescribed naproxen as needed for pain.  Discussed RICE therapy.  Discussed proper wound care.  Given orthopedic follow-up if needed.  Discussed follow-up and return precautions. Final Clinical Impressions(s) / UC Diagnoses   Final diagnoses:  Acute pain of left knee  Strain of left knee, initial encounter     Discharge Instructions      Your x-ray is negative for any fracture or underlying injury. It is likely that you have strained a ligament in your knee. We have provided you with a knee sleeve that you can wear as needed for comfort. You can take naproxen twice daily as needed for pain.  Do not take this with other NSAIDs including ibuprofen , Advil , Motrin , and Aleve. You can take 650 mg of Tylenol  every 6-8 hours as needed for breakthrough pain. Alternate between ice and heat as needed for pain. Rest and elevate your leg often. Apply over-the-counter antibiotic ointment to the abrasion on your knee and keep this area clean dry and covered. You can follow-up with [place] [facility] if your pain continues for further evaluation and management. Return here as needed.     ED Prescriptions     Medication Sig Dispense Auth. Provider   naproxen (NAPROSYN) 500 MG tablet Take 1 tablet (500 mg total) by mouth 2 (two) times daily. 30 tablet Levora Reas A, NP      PDMP not reviewed this encounter.   Levora Reas A, NP 05/26/24 [phone]

## 2024-05-26 NOTE — Discharge Instructions (Signed)
 Your x-ray is negative for any fracture or underlying injury. It is likely that you have strained a ligament in your knee. We have provided you with a knee sleeve that you can wear as needed for comfort. You can take naproxen twice daily as needed for pain.  Do not take this with other NSAIDs including ibuprofen , Advil , Motrin , and Aleve. You can take 650 mg of Tylenol  every 6-8 hours as needed for breakthrough pain. Alternate between ice and heat as needed for pain. Rest and elevate your leg often. Apply over-the-counter antibiotic ointment to the abrasion on your knee and keep this area clean dry and covered. You can follow-up with [place] [facility] if your pain continues for further evaluation and management. Return here as needed.

## 2024-05-26 NOTE — ED Triage Notes (Signed)
 Coming out of store last night and fell in parking lot onto her knees the left knee is still really sore she states 8/10, abrasion covered with bandage. She took tylenol  last night but nothing this am
